# Patient Record
Sex: Female | Born: 1935 | Race: White | Hispanic: No
Health system: Southern US, Community
[De-identification: ages and names within clinical notes are randomized; demographics above are authoritative.]

## PROBLEM LIST (undated history)

## (undated) DIAGNOSIS — I509 Heart failure, unspecified: Secondary | ICD-10-CM

## (undated) DIAGNOSIS — I350 Nonrheumatic aortic (valve) stenosis: Secondary | ICD-10-CM

## (undated) DIAGNOSIS — I4891 Unspecified atrial fibrillation: Secondary | ICD-10-CM

---

## 2005-06-13 ENCOUNTER — Ambulatory Visit: Payer: Self-pay | Admitting: Oncology

## 2005-09-05 ENCOUNTER — Ambulatory Visit: Payer: Self-pay | Admitting: Oncology

## 2005-09-28 ENCOUNTER — Encounter: Admission: RE | Admit: 2005-09-28 | Discharge: 2005-09-28 | Payer: Self-pay | Admitting: Oncology

## 2005-12-26 ENCOUNTER — Ambulatory Visit: Payer: Self-pay | Admitting: Oncology

## 2006-02-20 ENCOUNTER — Ambulatory Visit: Payer: Self-pay | Admitting: Oncology

## 2006-04-17 ENCOUNTER — Ambulatory Visit: Payer: Self-pay | Admitting: Oncology

## 2006-09-29 ENCOUNTER — Encounter: Admission: RE | Admit: 2006-09-29 | Discharge: 2006-09-29 | Payer: Self-pay | Admitting: Oncology

## 2006-10-04 ENCOUNTER — Ambulatory Visit: Payer: Self-pay | Admitting: Oncology

## 2006-10-09 ENCOUNTER — Ambulatory Visit: Payer: Self-pay | Admitting: Oncology

## 2006-11-24 ENCOUNTER — Ambulatory Visit: Payer: Self-pay | Admitting: Oncology

## 2007-04-04 ENCOUNTER — Ambulatory Visit: Payer: Self-pay | Admitting: Oncology

## 2015-03-07 DIAGNOSIS — E039 Hypothyroidism, unspecified: Secondary | ICD-10-CM | POA: Diagnosis not present

## 2015-03-07 DIAGNOSIS — I509 Heart failure, unspecified: Secondary | ICD-10-CM | POA: Diagnosis not present

## 2015-03-07 DIAGNOSIS — I4891 Unspecified atrial fibrillation: Secondary | ICD-10-CM | POA: Diagnosis not present

## 2015-03-07 DIAGNOSIS — F0280 Dementia in other diseases classified elsewhere without behavioral disturbance: Secondary | ICD-10-CM | POA: Diagnosis not present

## 2015-03-24 DIAGNOSIS — F320 Major depressive disorder, single episode, mild: Secondary | ICD-10-CM | POA: Diagnosis not present

## 2015-03-24 DIAGNOSIS — F411 Generalized anxiety disorder: Secondary | ICD-10-CM | POA: Diagnosis not present

## 2015-03-24 DIAGNOSIS — F0280 Dementia in other diseases classified elsewhere without behavioral disturbance: Secondary | ICD-10-CM | POA: Diagnosis not present

## 2015-03-25 DIAGNOSIS — H2513 Age-related nuclear cataract, bilateral: Secondary | ICD-10-CM | POA: Diagnosis not present

## 2015-03-31 DIAGNOSIS — R2689 Other abnormalities of gait and mobility: Secondary | ICD-10-CM | POA: Diagnosis not present

## 2015-03-31 DIAGNOSIS — M25561 Pain in right knee: Secondary | ICD-10-CM | POA: Diagnosis not present

## 2015-03-31 DIAGNOSIS — I5030 Unspecified diastolic (congestive) heart failure: Secondary | ICD-10-CM | POA: Diagnosis not present

## 2015-03-31 DIAGNOSIS — E039 Hypothyroidism, unspecified: Secondary | ICD-10-CM | POA: Diagnosis not present

## 2015-03-31 DIAGNOSIS — I10 Essential (primary) hypertension: Secondary | ICD-10-CM | POA: Diagnosis not present

## 2015-03-31 DIAGNOSIS — R2681 Unsteadiness on feet: Secondary | ICD-10-CM | POA: Diagnosis not present

## 2015-03-31 DIAGNOSIS — K219 Gastro-esophageal reflux disease without esophagitis: Secondary | ICD-10-CM | POA: Diagnosis not present

## 2015-03-31 DIAGNOSIS — I4891 Unspecified atrial fibrillation: Secondary | ICD-10-CM | POA: Diagnosis not present

## 2015-03-31 DIAGNOSIS — M6281 Muscle weakness (generalized): Secondary | ICD-10-CM | POA: Diagnosis not present

## 2015-03-31 DIAGNOSIS — M25569 Pain in unspecified knee: Secondary | ICD-10-CM | POA: Diagnosis not present

## 2015-03-31 DIAGNOSIS — F068 Other specified mental disorders due to known physiological condition: Secondary | ICD-10-CM | POA: Diagnosis not present

## 2015-04-01 DIAGNOSIS — I5030 Unspecified diastolic (congestive) heart failure: Secondary | ICD-10-CM | POA: Diagnosis not present

## 2015-04-01 DIAGNOSIS — M25569 Pain in unspecified knee: Secondary | ICD-10-CM | POA: Diagnosis not present

## 2015-04-01 DIAGNOSIS — F068 Other specified mental disorders due to known physiological condition: Secondary | ICD-10-CM | POA: Diagnosis not present

## 2015-04-01 DIAGNOSIS — M25561 Pain in right knee: Secondary | ICD-10-CM | POA: Diagnosis not present

## 2015-04-01 DIAGNOSIS — I10 Essential (primary) hypertension: Secondary | ICD-10-CM | POA: Diagnosis not present

## 2015-04-01 DIAGNOSIS — E039 Hypothyroidism, unspecified: Secondary | ICD-10-CM | POA: Diagnosis not present

## 2015-04-01 DIAGNOSIS — K219 Gastro-esophageal reflux disease without esophagitis: Secondary | ICD-10-CM | POA: Diagnosis not present

## 2015-04-01 DIAGNOSIS — R2681 Unsteadiness on feet: Secondary | ICD-10-CM | POA: Diagnosis not present

## 2015-04-01 DIAGNOSIS — I4891 Unspecified atrial fibrillation: Secondary | ICD-10-CM | POA: Diagnosis not present

## 2015-04-01 DIAGNOSIS — R2689 Other abnormalities of gait and mobility: Secondary | ICD-10-CM | POA: Diagnosis not present

## 2015-04-01 DIAGNOSIS — M6281 Muscle weakness (generalized): Secondary | ICD-10-CM | POA: Diagnosis not present

## 2015-04-02 DIAGNOSIS — F068 Other specified mental disorders due to known physiological condition: Secondary | ICD-10-CM | POA: Diagnosis not present

## 2015-04-02 DIAGNOSIS — R2681 Unsteadiness on feet: Secondary | ICD-10-CM | POA: Diagnosis not present

## 2015-04-02 DIAGNOSIS — M25561 Pain in right knee: Secondary | ICD-10-CM | POA: Diagnosis not present

## 2015-04-02 DIAGNOSIS — I4891 Unspecified atrial fibrillation: Secondary | ICD-10-CM | POA: Diagnosis not present

## 2015-04-02 DIAGNOSIS — I10 Essential (primary) hypertension: Secondary | ICD-10-CM | POA: Diagnosis not present

## 2015-04-02 DIAGNOSIS — M25569 Pain in unspecified knee: Secondary | ICD-10-CM | POA: Diagnosis not present

## 2015-04-02 DIAGNOSIS — K219 Gastro-esophageal reflux disease without esophagitis: Secondary | ICD-10-CM | POA: Diagnosis not present

## 2015-04-02 DIAGNOSIS — E039 Hypothyroidism, unspecified: Secondary | ICD-10-CM | POA: Diagnosis not present

## 2015-04-02 DIAGNOSIS — M6281 Muscle weakness (generalized): Secondary | ICD-10-CM | POA: Diagnosis not present

## 2015-04-02 DIAGNOSIS — R2689 Other abnormalities of gait and mobility: Secondary | ICD-10-CM | POA: Diagnosis not present

## 2015-04-02 DIAGNOSIS — I5030 Unspecified diastolic (congestive) heart failure: Secondary | ICD-10-CM | POA: Diagnosis not present

## 2015-04-03 DIAGNOSIS — R2689 Other abnormalities of gait and mobility: Secondary | ICD-10-CM | POA: Diagnosis not present

## 2015-04-03 DIAGNOSIS — M25569 Pain in unspecified knee: Secondary | ICD-10-CM | POA: Diagnosis not present

## 2015-04-03 DIAGNOSIS — I5030 Unspecified diastolic (congestive) heart failure: Secondary | ICD-10-CM | POA: Diagnosis not present

## 2015-04-03 DIAGNOSIS — M25561 Pain in right knee: Secondary | ICD-10-CM | POA: Diagnosis not present

## 2015-04-03 DIAGNOSIS — M6281 Muscle weakness (generalized): Secondary | ICD-10-CM | POA: Diagnosis not present

## 2015-04-03 DIAGNOSIS — I10 Essential (primary) hypertension: Secondary | ICD-10-CM | POA: Diagnosis not present

## 2015-04-03 DIAGNOSIS — K219 Gastro-esophageal reflux disease without esophagitis: Secondary | ICD-10-CM | POA: Diagnosis not present

## 2015-04-03 DIAGNOSIS — F068 Other specified mental disorders due to known physiological condition: Secondary | ICD-10-CM | POA: Diagnosis not present

## 2015-04-03 DIAGNOSIS — E039 Hypothyroidism, unspecified: Secondary | ICD-10-CM | POA: Diagnosis not present

## 2015-04-03 DIAGNOSIS — I4891 Unspecified atrial fibrillation: Secondary | ICD-10-CM | POA: Diagnosis not present

## 2015-04-03 DIAGNOSIS — R2681 Unsteadiness on feet: Secondary | ICD-10-CM | POA: Diagnosis not present

## 2015-04-06 DIAGNOSIS — E039 Hypothyroidism, unspecified: Secondary | ICD-10-CM | POA: Diagnosis not present

## 2015-04-06 DIAGNOSIS — M6281 Muscle weakness (generalized): Secondary | ICD-10-CM | POA: Diagnosis not present

## 2015-04-06 DIAGNOSIS — I5030 Unspecified diastolic (congestive) heart failure: Secondary | ICD-10-CM | POA: Diagnosis not present

## 2015-04-06 DIAGNOSIS — M25561 Pain in right knee: Secondary | ICD-10-CM | POA: Diagnosis not present

## 2015-04-06 DIAGNOSIS — F068 Other specified mental disorders due to known physiological condition: Secondary | ICD-10-CM | POA: Diagnosis not present

## 2015-04-06 DIAGNOSIS — I4891 Unspecified atrial fibrillation: Secondary | ICD-10-CM | POA: Diagnosis not present

## 2015-04-06 DIAGNOSIS — I10 Essential (primary) hypertension: Secondary | ICD-10-CM | POA: Diagnosis not present

## 2015-04-06 DIAGNOSIS — K219 Gastro-esophageal reflux disease without esophagitis: Secondary | ICD-10-CM | POA: Diagnosis not present

## 2015-04-06 DIAGNOSIS — R2681 Unsteadiness on feet: Secondary | ICD-10-CM | POA: Diagnosis not present

## 2015-04-06 DIAGNOSIS — R2689 Other abnormalities of gait and mobility: Secondary | ICD-10-CM | POA: Diagnosis not present

## 2015-04-06 DIAGNOSIS — M25569 Pain in unspecified knee: Secondary | ICD-10-CM | POA: Diagnosis not present

## 2015-04-07 DIAGNOSIS — I5030 Unspecified diastolic (congestive) heart failure: Secondary | ICD-10-CM | POA: Diagnosis not present

## 2015-04-07 DIAGNOSIS — R2689 Other abnormalities of gait and mobility: Secondary | ICD-10-CM | POA: Diagnosis not present

## 2015-04-07 DIAGNOSIS — R2681 Unsteadiness on feet: Secondary | ICD-10-CM | POA: Diagnosis not present

## 2015-04-07 DIAGNOSIS — I10 Essential (primary) hypertension: Secondary | ICD-10-CM | POA: Diagnosis not present

## 2015-04-07 DIAGNOSIS — M6281 Muscle weakness (generalized): Secondary | ICD-10-CM | POA: Diagnosis not present

## 2015-04-07 DIAGNOSIS — M25569 Pain in unspecified knee: Secondary | ICD-10-CM | POA: Diagnosis not present

## 2015-04-07 DIAGNOSIS — K219 Gastro-esophageal reflux disease without esophagitis: Secondary | ICD-10-CM | POA: Diagnosis not present

## 2015-04-07 DIAGNOSIS — M25561 Pain in right knee: Secondary | ICD-10-CM | POA: Diagnosis not present

## 2015-04-07 DIAGNOSIS — E039 Hypothyroidism, unspecified: Secondary | ICD-10-CM | POA: Diagnosis not present

## 2015-04-07 DIAGNOSIS — F068 Other specified mental disorders due to known physiological condition: Secondary | ICD-10-CM | POA: Diagnosis not present

## 2015-04-07 DIAGNOSIS — I4891 Unspecified atrial fibrillation: Secondary | ICD-10-CM | POA: Diagnosis not present

## 2015-04-08 DIAGNOSIS — M25561 Pain in right knee: Secondary | ICD-10-CM | POA: Diagnosis not present

## 2015-04-08 DIAGNOSIS — K219 Gastro-esophageal reflux disease without esophagitis: Secondary | ICD-10-CM | POA: Diagnosis not present

## 2015-04-08 DIAGNOSIS — I4891 Unspecified atrial fibrillation: Secondary | ICD-10-CM | POA: Diagnosis not present

## 2015-04-08 DIAGNOSIS — F068 Other specified mental disorders due to known physiological condition: Secondary | ICD-10-CM | POA: Diagnosis not present

## 2015-04-08 DIAGNOSIS — I10 Essential (primary) hypertension: Secondary | ICD-10-CM | POA: Diagnosis not present

## 2015-04-08 DIAGNOSIS — I5030 Unspecified diastolic (congestive) heart failure: Secondary | ICD-10-CM | POA: Diagnosis not present

## 2015-04-08 DIAGNOSIS — R2689 Other abnormalities of gait and mobility: Secondary | ICD-10-CM | POA: Diagnosis not present

## 2015-04-08 DIAGNOSIS — R2681 Unsteadiness on feet: Secondary | ICD-10-CM | POA: Diagnosis not present

## 2015-04-08 DIAGNOSIS — M25569 Pain in unspecified knee: Secondary | ICD-10-CM | POA: Diagnosis not present

## 2015-04-08 DIAGNOSIS — E039 Hypothyroidism, unspecified: Secondary | ICD-10-CM | POA: Diagnosis not present

## 2015-04-08 DIAGNOSIS — M6281 Muscle weakness (generalized): Secondary | ICD-10-CM | POA: Diagnosis not present

## 2015-04-09 DIAGNOSIS — I10 Essential (primary) hypertension: Secondary | ICD-10-CM | POA: Diagnosis not present

## 2015-04-09 DIAGNOSIS — I5030 Unspecified diastolic (congestive) heart failure: Secondary | ICD-10-CM | POA: Diagnosis not present

## 2015-04-09 DIAGNOSIS — R2681 Unsteadiness on feet: Secondary | ICD-10-CM | POA: Diagnosis not present

## 2015-04-09 DIAGNOSIS — R2689 Other abnormalities of gait and mobility: Secondary | ICD-10-CM | POA: Diagnosis not present

## 2015-04-09 DIAGNOSIS — M25561 Pain in right knee: Secondary | ICD-10-CM | POA: Diagnosis not present

## 2015-04-09 DIAGNOSIS — I4891 Unspecified atrial fibrillation: Secondary | ICD-10-CM | POA: Diagnosis not present

## 2015-04-09 DIAGNOSIS — E039 Hypothyroidism, unspecified: Secondary | ICD-10-CM | POA: Diagnosis not present

## 2015-04-09 DIAGNOSIS — F068 Other specified mental disorders due to known physiological condition: Secondary | ICD-10-CM | POA: Diagnosis not present

## 2015-04-09 DIAGNOSIS — K219 Gastro-esophageal reflux disease without esophagitis: Secondary | ICD-10-CM | POA: Diagnosis not present

## 2015-04-09 DIAGNOSIS — M25569 Pain in unspecified knee: Secondary | ICD-10-CM | POA: Diagnosis not present

## 2015-04-09 DIAGNOSIS — M6281 Muscle weakness (generalized): Secondary | ICD-10-CM | POA: Diagnosis not present

## 2015-04-10 DIAGNOSIS — M25561 Pain in right knee: Secondary | ICD-10-CM | POA: Diagnosis not present

## 2015-04-10 DIAGNOSIS — R2689 Other abnormalities of gait and mobility: Secondary | ICD-10-CM | POA: Diagnosis not present

## 2015-04-10 DIAGNOSIS — F068 Other specified mental disorders due to known physiological condition: Secondary | ICD-10-CM | POA: Diagnosis not present

## 2015-04-10 DIAGNOSIS — I10 Essential (primary) hypertension: Secondary | ICD-10-CM | POA: Diagnosis not present

## 2015-04-10 DIAGNOSIS — I4891 Unspecified atrial fibrillation: Secondary | ICD-10-CM | POA: Diagnosis not present

## 2015-04-10 DIAGNOSIS — E039 Hypothyroidism, unspecified: Secondary | ICD-10-CM | POA: Diagnosis not present

## 2015-04-10 DIAGNOSIS — I5030 Unspecified diastolic (congestive) heart failure: Secondary | ICD-10-CM | POA: Diagnosis not present

## 2015-04-10 DIAGNOSIS — R2681 Unsteadiness on feet: Secondary | ICD-10-CM | POA: Diagnosis not present

## 2015-04-10 DIAGNOSIS — M25569 Pain in unspecified knee: Secondary | ICD-10-CM | POA: Diagnosis not present

## 2015-04-10 DIAGNOSIS — K219 Gastro-esophageal reflux disease without esophagitis: Secondary | ICD-10-CM | POA: Diagnosis not present

## 2015-04-10 DIAGNOSIS — M6281 Muscle weakness (generalized): Secondary | ICD-10-CM | POA: Diagnosis not present

## 2015-04-11 DIAGNOSIS — F0280 Dementia in other diseases classified elsewhere without behavioral disturbance: Secondary | ICD-10-CM | POA: Diagnosis not present

## 2015-04-11 DIAGNOSIS — I10 Essential (primary) hypertension: Secondary | ICD-10-CM | POA: Diagnosis not present

## 2015-04-11 DIAGNOSIS — I509 Heart failure, unspecified: Secondary | ICD-10-CM | POA: Diagnosis not present

## 2015-04-11 DIAGNOSIS — I4891 Unspecified atrial fibrillation: Secondary | ICD-10-CM | POA: Diagnosis not present

## 2015-04-13 DIAGNOSIS — E039 Hypothyroidism, unspecified: Secondary | ICD-10-CM | POA: Diagnosis not present

## 2015-04-13 DIAGNOSIS — I5030 Unspecified diastolic (congestive) heart failure: Secondary | ICD-10-CM | POA: Diagnosis not present

## 2015-04-13 DIAGNOSIS — M25569 Pain in unspecified knee: Secondary | ICD-10-CM | POA: Diagnosis not present

## 2015-04-13 DIAGNOSIS — F068 Other specified mental disorders due to known physiological condition: Secondary | ICD-10-CM | POA: Diagnosis not present

## 2015-04-13 DIAGNOSIS — R2681 Unsteadiness on feet: Secondary | ICD-10-CM | POA: Diagnosis not present

## 2015-04-13 DIAGNOSIS — M6281 Muscle weakness (generalized): Secondary | ICD-10-CM | POA: Diagnosis not present

## 2015-04-13 DIAGNOSIS — I10 Essential (primary) hypertension: Secondary | ICD-10-CM | POA: Diagnosis not present

## 2015-04-13 DIAGNOSIS — M25561 Pain in right knee: Secondary | ICD-10-CM | POA: Diagnosis not present

## 2015-04-13 DIAGNOSIS — K219 Gastro-esophageal reflux disease without esophagitis: Secondary | ICD-10-CM | POA: Diagnosis not present

## 2015-04-13 DIAGNOSIS — R2689 Other abnormalities of gait and mobility: Secondary | ICD-10-CM | POA: Diagnosis not present

## 2015-04-13 DIAGNOSIS — I4891 Unspecified atrial fibrillation: Secondary | ICD-10-CM | POA: Diagnosis not present

## 2015-04-14 DIAGNOSIS — F068 Other specified mental disorders due to known physiological condition: Secondary | ICD-10-CM | POA: Diagnosis not present

## 2015-04-14 DIAGNOSIS — M25561 Pain in right knee: Secondary | ICD-10-CM | POA: Diagnosis not present

## 2015-04-14 DIAGNOSIS — I4891 Unspecified atrial fibrillation: Secondary | ICD-10-CM | POA: Diagnosis not present

## 2015-04-14 DIAGNOSIS — K219 Gastro-esophageal reflux disease without esophagitis: Secondary | ICD-10-CM | POA: Diagnosis not present

## 2015-04-14 DIAGNOSIS — R2689 Other abnormalities of gait and mobility: Secondary | ICD-10-CM | POA: Diagnosis not present

## 2015-04-14 DIAGNOSIS — E039 Hypothyroidism, unspecified: Secondary | ICD-10-CM | POA: Diagnosis not present

## 2015-04-14 DIAGNOSIS — I5030 Unspecified diastolic (congestive) heart failure: Secondary | ICD-10-CM | POA: Diagnosis not present

## 2015-04-14 DIAGNOSIS — M6281 Muscle weakness (generalized): Secondary | ICD-10-CM | POA: Diagnosis not present

## 2015-04-14 DIAGNOSIS — I10 Essential (primary) hypertension: Secondary | ICD-10-CM | POA: Diagnosis not present

## 2015-04-14 DIAGNOSIS — R2681 Unsteadiness on feet: Secondary | ICD-10-CM | POA: Diagnosis not present

## 2015-04-14 DIAGNOSIS — M25569 Pain in unspecified knee: Secondary | ICD-10-CM | POA: Diagnosis not present

## 2015-04-15 DIAGNOSIS — E039 Hypothyroidism, unspecified: Secondary | ICD-10-CM | POA: Diagnosis not present

## 2015-04-15 DIAGNOSIS — M25561 Pain in right knee: Secondary | ICD-10-CM | POA: Diagnosis not present

## 2015-04-15 DIAGNOSIS — I5030 Unspecified diastolic (congestive) heart failure: Secondary | ICD-10-CM | POA: Diagnosis not present

## 2015-04-15 DIAGNOSIS — R2681 Unsteadiness on feet: Secondary | ICD-10-CM | POA: Diagnosis not present

## 2015-04-15 DIAGNOSIS — I4891 Unspecified atrial fibrillation: Secondary | ICD-10-CM | POA: Diagnosis not present

## 2015-04-15 DIAGNOSIS — M25569 Pain in unspecified knee: Secondary | ICD-10-CM | POA: Diagnosis not present

## 2015-04-15 DIAGNOSIS — F068 Other specified mental disorders due to known physiological condition: Secondary | ICD-10-CM | POA: Diagnosis not present

## 2015-04-15 DIAGNOSIS — K219 Gastro-esophageal reflux disease without esophagitis: Secondary | ICD-10-CM | POA: Diagnosis not present

## 2015-04-15 DIAGNOSIS — I10 Essential (primary) hypertension: Secondary | ICD-10-CM | POA: Diagnosis not present

## 2015-04-15 DIAGNOSIS — M6281 Muscle weakness (generalized): Secondary | ICD-10-CM | POA: Diagnosis not present

## 2015-04-15 DIAGNOSIS — R2689 Other abnormalities of gait and mobility: Secondary | ICD-10-CM | POA: Diagnosis not present

## 2015-04-16 DIAGNOSIS — I4891 Unspecified atrial fibrillation: Secondary | ICD-10-CM | POA: Diagnosis not present

## 2015-04-16 DIAGNOSIS — M25561 Pain in right knee: Secondary | ICD-10-CM | POA: Diagnosis not present

## 2015-04-16 DIAGNOSIS — I5030 Unspecified diastolic (congestive) heart failure: Secondary | ICD-10-CM | POA: Diagnosis not present

## 2015-04-16 DIAGNOSIS — R2681 Unsteadiness on feet: Secondary | ICD-10-CM | POA: Diagnosis not present

## 2015-04-16 DIAGNOSIS — E039 Hypothyroidism, unspecified: Secondary | ICD-10-CM | POA: Diagnosis not present

## 2015-04-16 DIAGNOSIS — F068 Other specified mental disorders due to known physiological condition: Secondary | ICD-10-CM | POA: Diagnosis not present

## 2015-04-16 DIAGNOSIS — R2689 Other abnormalities of gait and mobility: Secondary | ICD-10-CM | POA: Diagnosis not present

## 2015-04-16 DIAGNOSIS — I10 Essential (primary) hypertension: Secondary | ICD-10-CM | POA: Diagnosis not present

## 2015-04-16 DIAGNOSIS — K219 Gastro-esophageal reflux disease without esophagitis: Secondary | ICD-10-CM | POA: Diagnosis not present

## 2015-04-16 DIAGNOSIS — M6281 Muscle weakness (generalized): Secondary | ICD-10-CM | POA: Diagnosis not present

## 2015-04-16 DIAGNOSIS — M25569 Pain in unspecified knee: Secondary | ICD-10-CM | POA: Diagnosis not present

## 2015-04-17 DIAGNOSIS — M6281 Muscle weakness (generalized): Secondary | ICD-10-CM | POA: Diagnosis not present

## 2015-04-17 DIAGNOSIS — F068 Other specified mental disorders due to known physiological condition: Secondary | ICD-10-CM | POA: Diagnosis not present

## 2015-04-17 DIAGNOSIS — I10 Essential (primary) hypertension: Secondary | ICD-10-CM | POA: Diagnosis not present

## 2015-04-17 DIAGNOSIS — R2681 Unsteadiness on feet: Secondary | ICD-10-CM | POA: Diagnosis not present

## 2015-04-17 DIAGNOSIS — M25561 Pain in right knee: Secondary | ICD-10-CM | POA: Diagnosis not present

## 2015-04-17 DIAGNOSIS — E039 Hypothyroidism, unspecified: Secondary | ICD-10-CM | POA: Diagnosis not present

## 2015-04-17 DIAGNOSIS — K219 Gastro-esophageal reflux disease without esophagitis: Secondary | ICD-10-CM | POA: Diagnosis not present

## 2015-04-17 DIAGNOSIS — M25569 Pain in unspecified knee: Secondary | ICD-10-CM | POA: Diagnosis not present

## 2015-04-17 DIAGNOSIS — R2689 Other abnormalities of gait and mobility: Secondary | ICD-10-CM | POA: Diagnosis not present

## 2015-04-17 DIAGNOSIS — I5030 Unspecified diastolic (congestive) heart failure: Secondary | ICD-10-CM | POA: Diagnosis not present

## 2015-04-17 DIAGNOSIS — I4891 Unspecified atrial fibrillation: Secondary | ICD-10-CM | POA: Diagnosis not present

## 2015-04-20 DIAGNOSIS — I10 Essential (primary) hypertension: Secondary | ICD-10-CM | POA: Diagnosis not present

## 2015-04-20 DIAGNOSIS — R2689 Other abnormalities of gait and mobility: Secondary | ICD-10-CM | POA: Diagnosis not present

## 2015-04-20 DIAGNOSIS — K219 Gastro-esophageal reflux disease without esophagitis: Secondary | ICD-10-CM | POA: Diagnosis not present

## 2015-04-20 DIAGNOSIS — I4891 Unspecified atrial fibrillation: Secondary | ICD-10-CM | POA: Diagnosis not present

## 2015-04-20 DIAGNOSIS — F068 Other specified mental disorders due to known physiological condition: Secondary | ICD-10-CM | POA: Diagnosis not present

## 2015-04-20 DIAGNOSIS — E039 Hypothyroidism, unspecified: Secondary | ICD-10-CM | POA: Diagnosis not present

## 2015-04-20 DIAGNOSIS — I5030 Unspecified diastolic (congestive) heart failure: Secondary | ICD-10-CM | POA: Diagnosis not present

## 2015-04-20 DIAGNOSIS — R2681 Unsteadiness on feet: Secondary | ICD-10-CM | POA: Diagnosis not present

## 2015-04-20 DIAGNOSIS — M25569 Pain in unspecified knee: Secondary | ICD-10-CM | POA: Diagnosis not present

## 2015-04-20 DIAGNOSIS — M25561 Pain in right knee: Secondary | ICD-10-CM | POA: Diagnosis not present

## 2015-04-20 DIAGNOSIS — M6281 Muscle weakness (generalized): Secondary | ICD-10-CM | POA: Diagnosis not present

## 2015-04-21 DIAGNOSIS — R2689 Other abnormalities of gait and mobility: Secondary | ICD-10-CM | POA: Diagnosis not present

## 2015-04-21 DIAGNOSIS — M6281 Muscle weakness (generalized): Secondary | ICD-10-CM | POA: Diagnosis not present

## 2015-04-21 DIAGNOSIS — M25561 Pain in right knee: Secondary | ICD-10-CM | POA: Diagnosis not present

## 2015-04-21 DIAGNOSIS — M25569 Pain in unspecified knee: Secondary | ICD-10-CM | POA: Diagnosis not present

## 2015-04-21 DIAGNOSIS — E039 Hypothyroidism, unspecified: Secondary | ICD-10-CM | POA: Diagnosis not present

## 2015-04-21 DIAGNOSIS — I10 Essential (primary) hypertension: Secondary | ICD-10-CM | POA: Diagnosis not present

## 2015-04-21 DIAGNOSIS — I4891 Unspecified atrial fibrillation: Secondary | ICD-10-CM | POA: Diagnosis not present

## 2015-04-21 DIAGNOSIS — K219 Gastro-esophageal reflux disease without esophagitis: Secondary | ICD-10-CM | POA: Diagnosis not present

## 2015-04-21 DIAGNOSIS — I5030 Unspecified diastolic (congestive) heart failure: Secondary | ICD-10-CM | POA: Diagnosis not present

## 2015-04-21 DIAGNOSIS — R2681 Unsteadiness on feet: Secondary | ICD-10-CM | POA: Diagnosis not present

## 2015-04-21 DIAGNOSIS — F068 Other specified mental disorders due to known physiological condition: Secondary | ICD-10-CM | POA: Diagnosis not present

## 2015-04-22 DIAGNOSIS — R2681 Unsteadiness on feet: Secondary | ICD-10-CM | POA: Diagnosis not present

## 2015-04-22 DIAGNOSIS — E039 Hypothyroidism, unspecified: Secondary | ICD-10-CM | POA: Diagnosis not present

## 2015-04-22 DIAGNOSIS — K219 Gastro-esophageal reflux disease without esophagitis: Secondary | ICD-10-CM | POA: Diagnosis not present

## 2015-04-22 DIAGNOSIS — I4891 Unspecified atrial fibrillation: Secondary | ICD-10-CM | POA: Diagnosis not present

## 2015-04-22 DIAGNOSIS — R2689 Other abnormalities of gait and mobility: Secondary | ICD-10-CM | POA: Diagnosis not present

## 2015-04-22 DIAGNOSIS — I10 Essential (primary) hypertension: Secondary | ICD-10-CM | POA: Diagnosis not present

## 2015-04-22 DIAGNOSIS — M6281 Muscle weakness (generalized): Secondary | ICD-10-CM | POA: Diagnosis not present

## 2015-04-22 DIAGNOSIS — M25569 Pain in unspecified knee: Secondary | ICD-10-CM | POA: Diagnosis not present

## 2015-04-22 DIAGNOSIS — F068 Other specified mental disorders due to known physiological condition: Secondary | ICD-10-CM | POA: Diagnosis not present

## 2015-04-22 DIAGNOSIS — I5030 Unspecified diastolic (congestive) heart failure: Secondary | ICD-10-CM | POA: Diagnosis not present

## 2015-04-22 DIAGNOSIS — M25561 Pain in right knee: Secondary | ICD-10-CM | POA: Diagnosis not present

## 2015-04-23 DIAGNOSIS — I5030 Unspecified diastolic (congestive) heart failure: Secondary | ICD-10-CM | POA: Diagnosis not present

## 2015-04-23 DIAGNOSIS — I10 Essential (primary) hypertension: Secondary | ICD-10-CM | POA: Diagnosis not present

## 2015-04-23 DIAGNOSIS — E039 Hypothyroidism, unspecified: Secondary | ICD-10-CM | POA: Diagnosis not present

## 2015-04-23 DIAGNOSIS — M25569 Pain in unspecified knee: Secondary | ICD-10-CM | POA: Diagnosis not present

## 2015-04-23 DIAGNOSIS — K219 Gastro-esophageal reflux disease without esophagitis: Secondary | ICD-10-CM | POA: Diagnosis not present

## 2015-04-23 DIAGNOSIS — R2681 Unsteadiness on feet: Secondary | ICD-10-CM | POA: Diagnosis not present

## 2015-04-23 DIAGNOSIS — R2689 Other abnormalities of gait and mobility: Secondary | ICD-10-CM | POA: Diagnosis not present

## 2015-04-23 DIAGNOSIS — F068 Other specified mental disorders due to known physiological condition: Secondary | ICD-10-CM | POA: Diagnosis not present

## 2015-04-23 DIAGNOSIS — I4891 Unspecified atrial fibrillation: Secondary | ICD-10-CM | POA: Diagnosis not present

## 2015-04-23 DIAGNOSIS — M6281 Muscle weakness (generalized): Secondary | ICD-10-CM | POA: Diagnosis not present

## 2015-04-23 DIAGNOSIS — M25561 Pain in right knee: Secondary | ICD-10-CM | POA: Diagnosis not present

## 2015-04-24 DIAGNOSIS — R2681 Unsteadiness on feet: Secondary | ICD-10-CM | POA: Diagnosis not present

## 2015-04-24 DIAGNOSIS — M25561 Pain in right knee: Secondary | ICD-10-CM | POA: Diagnosis not present

## 2015-04-24 DIAGNOSIS — F068 Other specified mental disorders due to known physiological condition: Secondary | ICD-10-CM | POA: Diagnosis not present

## 2015-04-24 DIAGNOSIS — I4891 Unspecified atrial fibrillation: Secondary | ICD-10-CM | POA: Diagnosis not present

## 2015-04-24 DIAGNOSIS — R2689 Other abnormalities of gait and mobility: Secondary | ICD-10-CM | POA: Diagnosis not present

## 2015-04-24 DIAGNOSIS — I10 Essential (primary) hypertension: Secondary | ICD-10-CM | POA: Diagnosis not present

## 2015-04-24 DIAGNOSIS — M6281 Muscle weakness (generalized): Secondary | ICD-10-CM | POA: Diagnosis not present

## 2015-04-24 DIAGNOSIS — I5030 Unspecified diastolic (congestive) heart failure: Secondary | ICD-10-CM | POA: Diagnosis not present

## 2015-04-24 DIAGNOSIS — E039 Hypothyroidism, unspecified: Secondary | ICD-10-CM | POA: Diagnosis not present

## 2015-04-24 DIAGNOSIS — K219 Gastro-esophageal reflux disease without esophagitis: Secondary | ICD-10-CM | POA: Diagnosis not present

## 2015-04-24 DIAGNOSIS — M25569 Pain in unspecified knee: Secondary | ICD-10-CM | POA: Diagnosis not present

## 2015-05-16 DIAGNOSIS — I4891 Unspecified atrial fibrillation: Secondary | ICD-10-CM | POA: Diagnosis not present

## 2015-05-16 DIAGNOSIS — F0280 Dementia in other diseases classified elsewhere without behavioral disturbance: Secondary | ICD-10-CM | POA: Diagnosis not present

## 2015-05-16 DIAGNOSIS — I509 Heart failure, unspecified: Secondary | ICD-10-CM | POA: Diagnosis not present

## 2015-05-21 DIAGNOSIS — I10 Essential (primary) hypertension: Secondary | ICD-10-CM | POA: Diagnosis not present

## 2015-05-21 DIAGNOSIS — M109 Gout, unspecified: Secondary | ICD-10-CM | POA: Diagnosis not present

## 2015-05-21 DIAGNOSIS — Z79899 Other long term (current) drug therapy: Secondary | ICD-10-CM | POA: Diagnosis not present

## 2015-06-04 DIAGNOSIS — E559 Vitamin D deficiency, unspecified: Secondary | ICD-10-CM | POA: Diagnosis not present

## 2015-06-10 DIAGNOSIS — I4891 Unspecified atrial fibrillation: Secondary | ICD-10-CM | POA: Diagnosis not present

## 2015-06-10 DIAGNOSIS — Z1389 Encounter for screening for other disorder: Secondary | ICD-10-CM | POA: Diagnosis not present

## 2015-06-10 DIAGNOSIS — Z0000 Encounter for general adult medical examination without abnormal findings: Secondary | ICD-10-CM | POA: Diagnosis not present

## 2015-06-10 DIAGNOSIS — Z853 Personal history of malignant neoplasm of breast: Secondary | ICD-10-CM | POA: Diagnosis not present

## 2015-06-10 DIAGNOSIS — R32 Unspecified urinary incontinence: Secondary | ICD-10-CM | POA: Diagnosis not present

## 2015-06-10 DIAGNOSIS — Z1211 Encounter for screening for malignant neoplasm of colon: Secondary | ICD-10-CM | POA: Diagnosis not present

## 2015-06-10 DIAGNOSIS — Z139 Encounter for screening, unspecified: Secondary | ICD-10-CM | POA: Diagnosis not present

## 2015-06-10 DIAGNOSIS — M109 Gout, unspecified: Secondary | ICD-10-CM | POA: Diagnosis not present

## 2015-07-07 DIAGNOSIS — F411 Generalized anxiety disorder: Secondary | ICD-10-CM | POA: Diagnosis not present

## 2015-07-07 DIAGNOSIS — F320 Major depressive disorder, single episode, mild: Secondary | ICD-10-CM | POA: Diagnosis not present

## 2015-07-07 DIAGNOSIS — F0280 Dementia in other diseases classified elsewhere without behavioral disturbance: Secondary | ICD-10-CM | POA: Diagnosis not present

## 2015-07-07 DIAGNOSIS — G309 Alzheimer's disease, unspecified: Secondary | ICD-10-CM | POA: Diagnosis not present

## 2015-07-17 DIAGNOSIS — F0390 Unspecified dementia without behavioral disturbance: Secondary | ICD-10-CM | POA: Diagnosis not present

## 2015-07-17 DIAGNOSIS — I421 Obstructive hypertrophic cardiomyopathy: Secondary | ICD-10-CM | POA: Diagnosis not present

## 2015-07-17 DIAGNOSIS — I4891 Unspecified atrial fibrillation: Secondary | ICD-10-CM | POA: Diagnosis not present

## 2015-07-17 DIAGNOSIS — I10 Essential (primary) hypertension: Secondary | ICD-10-CM | POA: Diagnosis not present

## 2015-08-15 DIAGNOSIS — Z79899 Other long term (current) drug therapy: Secondary | ICD-10-CM | POA: Diagnosis not present

## 2015-08-29 DIAGNOSIS — I4891 Unspecified atrial fibrillation: Secondary | ICD-10-CM | POA: Diagnosis not present

## 2015-08-29 DIAGNOSIS — I10 Essential (primary) hypertension: Secondary | ICD-10-CM | POA: Diagnosis not present

## 2015-08-29 DIAGNOSIS — I429 Cardiomyopathy, unspecified: Secondary | ICD-10-CM | POA: Diagnosis not present

## 2015-08-29 DIAGNOSIS — F0390 Unspecified dementia without behavioral disturbance: Secondary | ICD-10-CM | POA: Diagnosis not present

## 2015-09-01 DIAGNOSIS — Z79899 Other long term (current) drug therapy: Secondary | ICD-10-CM | POA: Diagnosis not present

## 2015-09-03 DIAGNOSIS — M25569 Pain in unspecified knee: Secondary | ICD-10-CM | POA: Diagnosis not present

## 2015-09-03 DIAGNOSIS — F068 Other specified mental disorders due to known physiological condition: Secondary | ICD-10-CM | POA: Diagnosis not present

## 2015-09-03 DIAGNOSIS — I10 Essential (primary) hypertension: Secondary | ICD-10-CM | POA: Diagnosis not present

## 2015-09-03 DIAGNOSIS — I4891 Unspecified atrial fibrillation: Secondary | ICD-10-CM | POA: Diagnosis not present

## 2015-09-03 DIAGNOSIS — M6281 Muscle weakness (generalized): Secondary | ICD-10-CM | POA: Diagnosis not present

## 2015-09-03 DIAGNOSIS — I5030 Unspecified diastolic (congestive) heart failure: Secondary | ICD-10-CM | POA: Diagnosis not present

## 2015-09-03 DIAGNOSIS — K219 Gastro-esophageal reflux disease without esophagitis: Secondary | ICD-10-CM | POA: Diagnosis not present

## 2015-09-03 DIAGNOSIS — R2681 Unsteadiness on feet: Secondary | ICD-10-CM | POA: Diagnosis not present

## 2015-09-03 DIAGNOSIS — R2689 Other abnormalities of gait and mobility: Secondary | ICD-10-CM | POA: Diagnosis not present

## 2015-09-03 DIAGNOSIS — M25561 Pain in right knee: Secondary | ICD-10-CM | POA: Diagnosis not present

## 2015-09-03 DIAGNOSIS — R488 Other symbolic dysfunctions: Secondary | ICD-10-CM | POA: Diagnosis not present

## 2015-09-03 DIAGNOSIS — E039 Hypothyroidism, unspecified: Secondary | ICD-10-CM | POA: Diagnosis not present

## 2015-09-07 DIAGNOSIS — I10 Essential (primary) hypertension: Secondary | ICD-10-CM | POA: Diagnosis not present

## 2015-09-07 DIAGNOSIS — M25561 Pain in right knee: Secondary | ICD-10-CM | POA: Diagnosis not present

## 2015-09-07 DIAGNOSIS — M6281 Muscle weakness (generalized): Secondary | ICD-10-CM | POA: Diagnosis not present

## 2015-09-07 DIAGNOSIS — R2681 Unsteadiness on feet: Secondary | ICD-10-CM | POA: Diagnosis not present

## 2015-09-07 DIAGNOSIS — I5030 Unspecified diastolic (congestive) heart failure: Secondary | ICD-10-CM | POA: Diagnosis not present

## 2015-09-07 DIAGNOSIS — K219 Gastro-esophageal reflux disease without esophagitis: Secondary | ICD-10-CM | POA: Diagnosis not present

## 2015-09-07 DIAGNOSIS — R2689 Other abnormalities of gait and mobility: Secondary | ICD-10-CM | POA: Diagnosis not present

## 2015-09-07 DIAGNOSIS — F068 Other specified mental disorders due to known physiological condition: Secondary | ICD-10-CM | POA: Diagnosis not present

## 2015-09-07 DIAGNOSIS — I4891 Unspecified atrial fibrillation: Secondary | ICD-10-CM | POA: Diagnosis not present

## 2015-09-07 DIAGNOSIS — F0280 Dementia in other diseases classified elsewhere without behavioral disturbance: Secondary | ICD-10-CM | POA: Diagnosis not present

## 2015-09-07 DIAGNOSIS — R488 Other symbolic dysfunctions: Secondary | ICD-10-CM | POA: Diagnosis not present

## 2015-09-07 DIAGNOSIS — E039 Hypothyroidism, unspecified: Secondary | ICD-10-CM | POA: Diagnosis not present

## 2015-09-07 DIAGNOSIS — M25569 Pain in unspecified knee: Secondary | ICD-10-CM | POA: Diagnosis not present

## 2015-09-09 DIAGNOSIS — M6281 Muscle weakness (generalized): Secondary | ICD-10-CM | POA: Diagnosis not present

## 2015-09-09 DIAGNOSIS — F0280 Dementia in other diseases classified elsewhere without behavioral disturbance: Secondary | ICD-10-CM | POA: Diagnosis not present

## 2015-09-09 DIAGNOSIS — M25569 Pain in unspecified knee: Secondary | ICD-10-CM | POA: Diagnosis not present

## 2015-09-09 DIAGNOSIS — I10 Essential (primary) hypertension: Secondary | ICD-10-CM | POA: Diagnosis not present

## 2015-09-09 DIAGNOSIS — K219 Gastro-esophageal reflux disease without esophagitis: Secondary | ICD-10-CM | POA: Diagnosis not present

## 2015-09-09 DIAGNOSIS — I4891 Unspecified atrial fibrillation: Secondary | ICD-10-CM | POA: Diagnosis not present

## 2015-09-09 DIAGNOSIS — R488 Other symbolic dysfunctions: Secondary | ICD-10-CM | POA: Diagnosis not present

## 2015-09-09 DIAGNOSIS — M25561 Pain in right knee: Secondary | ICD-10-CM | POA: Diagnosis not present

## 2015-09-09 DIAGNOSIS — E039 Hypothyroidism, unspecified: Secondary | ICD-10-CM | POA: Diagnosis not present

## 2015-09-09 DIAGNOSIS — I5030 Unspecified diastolic (congestive) heart failure: Secondary | ICD-10-CM | POA: Diagnosis not present

## 2015-09-09 DIAGNOSIS — R2689 Other abnormalities of gait and mobility: Secondary | ICD-10-CM | POA: Diagnosis not present

## 2015-09-09 DIAGNOSIS — R2681 Unsteadiness on feet: Secondary | ICD-10-CM | POA: Diagnosis not present

## 2015-09-09 DIAGNOSIS — F068 Other specified mental disorders due to known physiological condition: Secondary | ICD-10-CM | POA: Diagnosis not present

## 2015-09-10 DIAGNOSIS — E039 Hypothyroidism, unspecified: Secondary | ICD-10-CM | POA: Diagnosis not present

## 2015-09-10 DIAGNOSIS — M6281 Muscle weakness (generalized): Secondary | ICD-10-CM | POA: Diagnosis not present

## 2015-09-10 DIAGNOSIS — F0280 Dementia in other diseases classified elsewhere without behavioral disturbance: Secondary | ICD-10-CM | POA: Diagnosis not present

## 2015-09-10 DIAGNOSIS — I10 Essential (primary) hypertension: Secondary | ICD-10-CM | POA: Diagnosis not present

## 2015-09-10 DIAGNOSIS — K219 Gastro-esophageal reflux disease without esophagitis: Secondary | ICD-10-CM | POA: Diagnosis not present

## 2015-09-10 DIAGNOSIS — I5030 Unspecified diastolic (congestive) heart failure: Secondary | ICD-10-CM | POA: Diagnosis not present

## 2015-09-10 DIAGNOSIS — R2689 Other abnormalities of gait and mobility: Secondary | ICD-10-CM | POA: Diagnosis not present

## 2015-09-10 DIAGNOSIS — I4891 Unspecified atrial fibrillation: Secondary | ICD-10-CM | POA: Diagnosis not present

## 2015-09-10 DIAGNOSIS — F068 Other specified mental disorders due to known physiological condition: Secondary | ICD-10-CM | POA: Diagnosis not present

## 2015-09-10 DIAGNOSIS — M25569 Pain in unspecified knee: Secondary | ICD-10-CM | POA: Diagnosis not present

## 2015-09-10 DIAGNOSIS — R488 Other symbolic dysfunctions: Secondary | ICD-10-CM | POA: Diagnosis not present

## 2015-09-10 DIAGNOSIS — M25561 Pain in right knee: Secondary | ICD-10-CM | POA: Diagnosis not present

## 2015-09-10 DIAGNOSIS — R2681 Unsteadiness on feet: Secondary | ICD-10-CM | POA: Diagnosis not present

## 2015-09-11 DIAGNOSIS — I10 Essential (primary) hypertension: Secondary | ICD-10-CM | POA: Diagnosis not present

## 2015-09-11 DIAGNOSIS — E039 Hypothyroidism, unspecified: Secondary | ICD-10-CM | POA: Diagnosis not present

## 2015-09-11 DIAGNOSIS — R2689 Other abnormalities of gait and mobility: Secondary | ICD-10-CM | POA: Diagnosis not present

## 2015-09-11 DIAGNOSIS — M25569 Pain in unspecified knee: Secondary | ICD-10-CM | POA: Diagnosis not present

## 2015-09-11 DIAGNOSIS — K219 Gastro-esophageal reflux disease without esophagitis: Secondary | ICD-10-CM | POA: Diagnosis not present

## 2015-09-11 DIAGNOSIS — R488 Other symbolic dysfunctions: Secondary | ICD-10-CM | POA: Diagnosis not present

## 2015-09-11 DIAGNOSIS — M25561 Pain in right knee: Secondary | ICD-10-CM | POA: Diagnosis not present

## 2015-09-11 DIAGNOSIS — F0280 Dementia in other diseases classified elsewhere without behavioral disturbance: Secondary | ICD-10-CM | POA: Diagnosis not present

## 2015-09-11 DIAGNOSIS — M6281 Muscle weakness (generalized): Secondary | ICD-10-CM | POA: Diagnosis not present

## 2015-09-11 DIAGNOSIS — F068 Other specified mental disorders due to known physiological condition: Secondary | ICD-10-CM | POA: Diagnosis not present

## 2015-09-11 DIAGNOSIS — R2681 Unsteadiness on feet: Secondary | ICD-10-CM | POA: Diagnosis not present

## 2015-09-11 DIAGNOSIS — I5030 Unspecified diastolic (congestive) heart failure: Secondary | ICD-10-CM | POA: Diagnosis not present

## 2015-09-11 DIAGNOSIS — I4891 Unspecified atrial fibrillation: Secondary | ICD-10-CM | POA: Diagnosis not present

## 2015-09-12 DIAGNOSIS — I5030 Unspecified diastolic (congestive) heart failure: Secondary | ICD-10-CM | POA: Diagnosis not present

## 2015-09-12 DIAGNOSIS — F068 Other specified mental disorders due to known physiological condition: Secondary | ICD-10-CM | POA: Diagnosis not present

## 2015-09-12 DIAGNOSIS — M25561 Pain in right knee: Secondary | ICD-10-CM | POA: Diagnosis not present

## 2015-09-12 DIAGNOSIS — M25569 Pain in unspecified knee: Secondary | ICD-10-CM | POA: Diagnosis not present

## 2015-09-12 DIAGNOSIS — K219 Gastro-esophageal reflux disease without esophagitis: Secondary | ICD-10-CM | POA: Diagnosis not present

## 2015-09-12 DIAGNOSIS — I10 Essential (primary) hypertension: Secondary | ICD-10-CM | POA: Diagnosis not present

## 2015-09-12 DIAGNOSIS — E039 Hypothyroidism, unspecified: Secondary | ICD-10-CM | POA: Diagnosis not present

## 2015-09-12 DIAGNOSIS — I4891 Unspecified atrial fibrillation: Secondary | ICD-10-CM | POA: Diagnosis not present

## 2015-09-12 DIAGNOSIS — R488 Other symbolic dysfunctions: Secondary | ICD-10-CM | POA: Diagnosis not present

## 2015-09-12 DIAGNOSIS — M6281 Muscle weakness (generalized): Secondary | ICD-10-CM | POA: Diagnosis not present

## 2015-09-12 DIAGNOSIS — R2681 Unsteadiness on feet: Secondary | ICD-10-CM | POA: Diagnosis not present

## 2015-09-12 DIAGNOSIS — F0280 Dementia in other diseases classified elsewhere without behavioral disturbance: Secondary | ICD-10-CM | POA: Diagnosis not present

## 2015-09-12 DIAGNOSIS — R2689 Other abnormalities of gait and mobility: Secondary | ICD-10-CM | POA: Diagnosis not present

## 2015-09-17 DIAGNOSIS — I4891 Unspecified atrial fibrillation: Secondary | ICD-10-CM | POA: Diagnosis not present

## 2015-09-17 DIAGNOSIS — K219 Gastro-esophageal reflux disease without esophagitis: Secondary | ICD-10-CM | POA: Diagnosis not present

## 2015-09-17 DIAGNOSIS — R488 Other symbolic dysfunctions: Secondary | ICD-10-CM | POA: Diagnosis not present

## 2015-09-17 DIAGNOSIS — E039 Hypothyroidism, unspecified: Secondary | ICD-10-CM | POA: Diagnosis not present

## 2015-09-17 DIAGNOSIS — R2689 Other abnormalities of gait and mobility: Secondary | ICD-10-CM | POA: Diagnosis not present

## 2015-09-17 DIAGNOSIS — M6281 Muscle weakness (generalized): Secondary | ICD-10-CM | POA: Diagnosis not present

## 2015-09-17 DIAGNOSIS — F0280 Dementia in other diseases classified elsewhere without behavioral disturbance: Secondary | ICD-10-CM | POA: Diagnosis not present

## 2015-09-17 DIAGNOSIS — I5030 Unspecified diastolic (congestive) heart failure: Secondary | ICD-10-CM | POA: Diagnosis not present

## 2015-09-17 DIAGNOSIS — R2681 Unsteadiness on feet: Secondary | ICD-10-CM | POA: Diagnosis not present

## 2015-09-17 DIAGNOSIS — M25569 Pain in unspecified knee: Secondary | ICD-10-CM | POA: Diagnosis not present

## 2015-09-17 DIAGNOSIS — M25561 Pain in right knee: Secondary | ICD-10-CM | POA: Diagnosis not present

## 2015-09-17 DIAGNOSIS — I10 Essential (primary) hypertension: Secondary | ICD-10-CM | POA: Diagnosis not present

## 2015-09-17 DIAGNOSIS — F068 Other specified mental disorders due to known physiological condition: Secondary | ICD-10-CM | POA: Diagnosis not present

## 2015-10-03 DIAGNOSIS — F0390 Unspecified dementia without behavioral disturbance: Secondary | ICD-10-CM | POA: Diagnosis not present

## 2015-10-03 DIAGNOSIS — I421 Obstructive hypertrophic cardiomyopathy: Secondary | ICD-10-CM | POA: Diagnosis not present

## 2015-10-03 DIAGNOSIS — I10 Essential (primary) hypertension: Secondary | ICD-10-CM | POA: Diagnosis not present

## 2015-10-03 DIAGNOSIS — I4891 Unspecified atrial fibrillation: Secondary | ICD-10-CM | POA: Diagnosis not present

## 2015-10-13 DIAGNOSIS — N390 Urinary tract infection, site not specified: Secondary | ICD-10-CM | POA: Diagnosis not present

## 2015-11-07 DIAGNOSIS — F0390 Unspecified dementia without behavioral disturbance: Secondary | ICD-10-CM | POA: Diagnosis not present

## 2015-11-07 DIAGNOSIS — I10 Essential (primary) hypertension: Secondary | ICD-10-CM | POA: Diagnosis not present

## 2015-11-07 DIAGNOSIS — I4891 Unspecified atrial fibrillation: Secondary | ICD-10-CM | POA: Diagnosis not present

## 2015-11-07 DIAGNOSIS — M1000 Idiopathic gout, unspecified site: Secondary | ICD-10-CM | POA: Diagnosis not present

## 2015-11-20 DIAGNOSIS — I4891 Unspecified atrial fibrillation: Secondary | ICD-10-CM | POA: Diagnosis not present

## 2015-11-20 DIAGNOSIS — I10 Essential (primary) hypertension: Secondary | ICD-10-CM | POA: Diagnosis not present

## 2015-11-20 DIAGNOSIS — E039 Hypothyroidism, unspecified: Secondary | ICD-10-CM | POA: Diagnosis not present

## 2015-11-20 DIAGNOSIS — M109 Gout, unspecified: Secondary | ICD-10-CM | POA: Diagnosis not present

## 2015-11-20 DIAGNOSIS — Z79899 Other long term (current) drug therapy: Secondary | ICD-10-CM | POA: Diagnosis not present

## 2015-12-16 DIAGNOSIS — R0989 Other specified symptoms and signs involving the circulatory and respiratory systems: Secondary | ICD-10-CM | POA: Diagnosis not present

## 2015-12-16 DIAGNOSIS — R05 Cough: Secondary | ICD-10-CM | POA: Diagnosis not present

## 2015-12-16 DIAGNOSIS — I421 Obstructive hypertrophic cardiomyopathy: Secondary | ICD-10-CM | POA: Diagnosis not present

## 2015-12-26 DIAGNOSIS — Z79899 Other long term (current) drug therapy: Secondary | ICD-10-CM | POA: Diagnosis not present

## 2016-01-19 DIAGNOSIS — R05 Cough: Secondary | ICD-10-CM | POA: Diagnosis not present

## 2016-01-19 DIAGNOSIS — R0989 Other specified symptoms and signs involving the circulatory and respiratory systems: Secondary | ICD-10-CM | POA: Diagnosis not present

## 2016-02-08 DIAGNOSIS — F0280 Dementia in other diseases classified elsewhere without behavioral disturbance: Secondary | ICD-10-CM | POA: Diagnosis not present

## 2016-02-08 DIAGNOSIS — I5030 Unspecified diastolic (congestive) heart failure: Secondary | ICD-10-CM | POA: Diagnosis not present

## 2016-02-08 DIAGNOSIS — M6281 Muscle weakness (generalized): Secondary | ICD-10-CM | POA: Diagnosis not present

## 2016-02-08 DIAGNOSIS — E039 Hypothyroidism, unspecified: Secondary | ICD-10-CM | POA: Diagnosis not present

## 2016-02-08 DIAGNOSIS — M25569 Pain in unspecified knee: Secondary | ICD-10-CM | POA: Diagnosis not present

## 2016-02-08 DIAGNOSIS — M25561 Pain in right knee: Secondary | ICD-10-CM | POA: Diagnosis not present

## 2016-02-08 DIAGNOSIS — K219 Gastro-esophageal reflux disease without esophagitis: Secondary | ICD-10-CM | POA: Diagnosis not present

## 2016-02-08 DIAGNOSIS — F068 Other specified mental disorders due to known physiological condition: Secondary | ICD-10-CM | POA: Diagnosis not present

## 2016-02-08 DIAGNOSIS — I10 Essential (primary) hypertension: Secondary | ICD-10-CM | POA: Diagnosis not present

## 2016-02-08 DIAGNOSIS — R2681 Unsteadiness on feet: Secondary | ICD-10-CM | POA: Diagnosis not present

## 2016-02-08 DIAGNOSIS — R2689 Other abnormalities of gait and mobility: Secondary | ICD-10-CM | POA: Diagnosis not present

## 2016-02-08 DIAGNOSIS — R488 Other symbolic dysfunctions: Secondary | ICD-10-CM | POA: Diagnosis not present

## 2016-02-08 DIAGNOSIS — I4891 Unspecified atrial fibrillation: Secondary | ICD-10-CM | POA: Diagnosis not present

## 2016-02-09 DIAGNOSIS — E039 Hypothyroidism, unspecified: Secondary | ICD-10-CM | POA: Diagnosis not present

## 2016-02-09 DIAGNOSIS — F0280 Dementia in other diseases classified elsewhere without behavioral disturbance: Secondary | ICD-10-CM | POA: Diagnosis not present

## 2016-02-09 DIAGNOSIS — M6281 Muscle weakness (generalized): Secondary | ICD-10-CM | POA: Diagnosis not present

## 2016-02-09 DIAGNOSIS — K219 Gastro-esophageal reflux disease without esophagitis: Secondary | ICD-10-CM | POA: Diagnosis not present

## 2016-02-09 DIAGNOSIS — R488 Other symbolic dysfunctions: Secondary | ICD-10-CM | POA: Diagnosis not present

## 2016-02-09 DIAGNOSIS — F068 Other specified mental disorders due to known physiological condition: Secondary | ICD-10-CM | POA: Diagnosis not present

## 2016-02-09 DIAGNOSIS — R2689 Other abnormalities of gait and mobility: Secondary | ICD-10-CM | POA: Diagnosis not present

## 2016-02-09 DIAGNOSIS — M25569 Pain in unspecified knee: Secondary | ICD-10-CM | POA: Diagnosis not present

## 2016-02-09 DIAGNOSIS — R2681 Unsteadiness on feet: Secondary | ICD-10-CM | POA: Diagnosis not present

## 2016-02-09 DIAGNOSIS — M25561 Pain in right knee: Secondary | ICD-10-CM | POA: Diagnosis not present

## 2016-02-09 DIAGNOSIS — I5030 Unspecified diastolic (congestive) heart failure: Secondary | ICD-10-CM | POA: Diagnosis not present

## 2016-02-09 DIAGNOSIS — I4891 Unspecified atrial fibrillation: Secondary | ICD-10-CM | POA: Diagnosis not present

## 2016-02-09 DIAGNOSIS — I10 Essential (primary) hypertension: Secondary | ICD-10-CM | POA: Diagnosis not present

## 2016-02-10 DIAGNOSIS — F068 Other specified mental disorders due to known physiological condition: Secondary | ICD-10-CM | POA: Diagnosis not present

## 2016-02-10 DIAGNOSIS — F0280 Dementia in other diseases classified elsewhere without behavioral disturbance: Secondary | ICD-10-CM | POA: Diagnosis not present

## 2016-02-10 DIAGNOSIS — M25569 Pain in unspecified knee: Secondary | ICD-10-CM | POA: Diagnosis not present

## 2016-02-10 DIAGNOSIS — I4891 Unspecified atrial fibrillation: Secondary | ICD-10-CM | POA: Diagnosis not present

## 2016-02-10 DIAGNOSIS — K219 Gastro-esophageal reflux disease without esophagitis: Secondary | ICD-10-CM | POA: Diagnosis not present

## 2016-02-10 DIAGNOSIS — E039 Hypothyroidism, unspecified: Secondary | ICD-10-CM | POA: Diagnosis not present

## 2016-02-10 DIAGNOSIS — M6281 Muscle weakness (generalized): Secondary | ICD-10-CM | POA: Diagnosis not present

## 2016-02-10 DIAGNOSIS — R2681 Unsteadiness on feet: Secondary | ICD-10-CM | POA: Diagnosis not present

## 2016-02-10 DIAGNOSIS — R488 Other symbolic dysfunctions: Secondary | ICD-10-CM | POA: Diagnosis not present

## 2016-02-10 DIAGNOSIS — I5030 Unspecified diastolic (congestive) heart failure: Secondary | ICD-10-CM | POA: Diagnosis not present

## 2016-02-10 DIAGNOSIS — I10 Essential (primary) hypertension: Secondary | ICD-10-CM | POA: Diagnosis not present

## 2016-02-10 DIAGNOSIS — R2689 Other abnormalities of gait and mobility: Secondary | ICD-10-CM | POA: Diagnosis not present

## 2016-02-10 DIAGNOSIS — M25561 Pain in right knee: Secondary | ICD-10-CM | POA: Diagnosis not present

## 2016-02-11 DIAGNOSIS — I10 Essential (primary) hypertension: Secondary | ICD-10-CM | POA: Diagnosis not present

## 2016-02-11 DIAGNOSIS — F068 Other specified mental disorders due to known physiological condition: Secondary | ICD-10-CM | POA: Diagnosis not present

## 2016-02-11 DIAGNOSIS — R2689 Other abnormalities of gait and mobility: Secondary | ICD-10-CM | POA: Diagnosis not present

## 2016-02-11 DIAGNOSIS — R488 Other symbolic dysfunctions: Secondary | ICD-10-CM | POA: Diagnosis not present

## 2016-02-11 DIAGNOSIS — I4891 Unspecified atrial fibrillation: Secondary | ICD-10-CM | POA: Diagnosis not present

## 2016-02-11 DIAGNOSIS — M6281 Muscle weakness (generalized): Secondary | ICD-10-CM | POA: Diagnosis not present

## 2016-02-11 DIAGNOSIS — E039 Hypothyroidism, unspecified: Secondary | ICD-10-CM | POA: Diagnosis not present

## 2016-02-11 DIAGNOSIS — I5030 Unspecified diastolic (congestive) heart failure: Secondary | ICD-10-CM | POA: Diagnosis not present

## 2016-02-11 DIAGNOSIS — M25561 Pain in right knee: Secondary | ICD-10-CM | POA: Diagnosis not present

## 2016-02-11 DIAGNOSIS — M25569 Pain in unspecified knee: Secondary | ICD-10-CM | POA: Diagnosis not present

## 2016-02-11 DIAGNOSIS — F0280 Dementia in other diseases classified elsewhere without behavioral disturbance: Secondary | ICD-10-CM | POA: Diagnosis not present

## 2016-02-11 DIAGNOSIS — R2681 Unsteadiness on feet: Secondary | ICD-10-CM | POA: Diagnosis not present

## 2016-02-11 DIAGNOSIS — K219 Gastro-esophageal reflux disease without esophagitis: Secondary | ICD-10-CM | POA: Diagnosis not present

## 2016-02-12 DIAGNOSIS — M6281 Muscle weakness (generalized): Secondary | ICD-10-CM | POA: Diagnosis not present

## 2016-02-12 DIAGNOSIS — I5030 Unspecified diastolic (congestive) heart failure: Secondary | ICD-10-CM | POA: Diagnosis not present

## 2016-02-12 DIAGNOSIS — I4891 Unspecified atrial fibrillation: Secondary | ICD-10-CM | POA: Diagnosis not present

## 2016-02-12 DIAGNOSIS — R2689 Other abnormalities of gait and mobility: Secondary | ICD-10-CM | POA: Diagnosis not present

## 2016-02-12 DIAGNOSIS — M25561 Pain in right knee: Secondary | ICD-10-CM | POA: Diagnosis not present

## 2016-02-12 DIAGNOSIS — R2681 Unsteadiness on feet: Secondary | ICD-10-CM | POA: Diagnosis not present

## 2016-02-12 DIAGNOSIS — R488 Other symbolic dysfunctions: Secondary | ICD-10-CM | POA: Diagnosis not present

## 2016-02-12 DIAGNOSIS — I10 Essential (primary) hypertension: Secondary | ICD-10-CM | POA: Diagnosis not present

## 2016-02-12 DIAGNOSIS — K219 Gastro-esophageal reflux disease without esophagitis: Secondary | ICD-10-CM | POA: Diagnosis not present

## 2016-02-12 DIAGNOSIS — F068 Other specified mental disorders due to known physiological condition: Secondary | ICD-10-CM | POA: Diagnosis not present

## 2016-02-12 DIAGNOSIS — E039 Hypothyroidism, unspecified: Secondary | ICD-10-CM | POA: Diagnosis not present

## 2016-02-12 DIAGNOSIS — F0280 Dementia in other diseases classified elsewhere without behavioral disturbance: Secondary | ICD-10-CM | POA: Diagnosis not present

## 2016-02-12 DIAGNOSIS — M25569 Pain in unspecified knee: Secondary | ICD-10-CM | POA: Diagnosis not present

## 2016-02-15 DIAGNOSIS — M6281 Muscle weakness (generalized): Secondary | ICD-10-CM | POA: Diagnosis not present

## 2016-02-15 DIAGNOSIS — R488 Other symbolic dysfunctions: Secondary | ICD-10-CM | POA: Diagnosis not present

## 2016-02-15 DIAGNOSIS — E039 Hypothyroidism, unspecified: Secondary | ICD-10-CM | POA: Diagnosis not present

## 2016-02-15 DIAGNOSIS — F0280 Dementia in other diseases classified elsewhere without behavioral disturbance: Secondary | ICD-10-CM | POA: Diagnosis not present

## 2016-02-15 DIAGNOSIS — F068 Other specified mental disorders due to known physiological condition: Secondary | ICD-10-CM | POA: Diagnosis not present

## 2016-02-15 DIAGNOSIS — R2681 Unsteadiness on feet: Secondary | ICD-10-CM | POA: Diagnosis not present

## 2016-02-15 DIAGNOSIS — M25561 Pain in right knee: Secondary | ICD-10-CM | POA: Diagnosis not present

## 2016-02-15 DIAGNOSIS — R2689 Other abnormalities of gait and mobility: Secondary | ICD-10-CM | POA: Diagnosis not present

## 2016-02-15 DIAGNOSIS — K219 Gastro-esophageal reflux disease without esophagitis: Secondary | ICD-10-CM | POA: Diagnosis not present

## 2016-02-15 DIAGNOSIS — I10 Essential (primary) hypertension: Secondary | ICD-10-CM | POA: Diagnosis not present

## 2016-02-15 DIAGNOSIS — I5030 Unspecified diastolic (congestive) heart failure: Secondary | ICD-10-CM | POA: Diagnosis not present

## 2016-02-15 DIAGNOSIS — I4891 Unspecified atrial fibrillation: Secondary | ICD-10-CM | POA: Diagnosis not present

## 2016-02-15 DIAGNOSIS — M25569 Pain in unspecified knee: Secondary | ICD-10-CM | POA: Diagnosis not present

## 2016-02-16 DIAGNOSIS — R2681 Unsteadiness on feet: Secondary | ICD-10-CM | POA: Diagnosis not present

## 2016-02-16 DIAGNOSIS — R488 Other symbolic dysfunctions: Secondary | ICD-10-CM | POA: Diagnosis not present

## 2016-02-16 DIAGNOSIS — I4891 Unspecified atrial fibrillation: Secondary | ICD-10-CM | POA: Diagnosis not present

## 2016-02-16 DIAGNOSIS — I10 Essential (primary) hypertension: Secondary | ICD-10-CM | POA: Diagnosis not present

## 2016-02-16 DIAGNOSIS — M25561 Pain in right knee: Secondary | ICD-10-CM | POA: Diagnosis not present

## 2016-02-16 DIAGNOSIS — F0280 Dementia in other diseases classified elsewhere without behavioral disturbance: Secondary | ICD-10-CM | POA: Diagnosis not present

## 2016-02-16 DIAGNOSIS — F068 Other specified mental disorders due to known physiological condition: Secondary | ICD-10-CM | POA: Diagnosis not present

## 2016-02-16 DIAGNOSIS — I5030 Unspecified diastolic (congestive) heart failure: Secondary | ICD-10-CM | POA: Diagnosis not present

## 2016-02-16 DIAGNOSIS — R2689 Other abnormalities of gait and mobility: Secondary | ICD-10-CM | POA: Diagnosis not present

## 2016-02-16 DIAGNOSIS — M25569 Pain in unspecified knee: Secondary | ICD-10-CM | POA: Diagnosis not present

## 2016-02-16 DIAGNOSIS — M6281 Muscle weakness (generalized): Secondary | ICD-10-CM | POA: Diagnosis not present

## 2016-02-16 DIAGNOSIS — K219 Gastro-esophageal reflux disease without esophagitis: Secondary | ICD-10-CM | POA: Diagnosis not present

## 2016-02-16 DIAGNOSIS — E039 Hypothyroidism, unspecified: Secondary | ICD-10-CM | POA: Diagnosis not present

## 2016-02-17 DIAGNOSIS — F0280 Dementia in other diseases classified elsewhere without behavioral disturbance: Secondary | ICD-10-CM | POA: Diagnosis not present

## 2016-02-17 DIAGNOSIS — M25569 Pain in unspecified knee: Secondary | ICD-10-CM | POA: Diagnosis not present

## 2016-02-17 DIAGNOSIS — R2681 Unsteadiness on feet: Secondary | ICD-10-CM | POA: Diagnosis not present

## 2016-02-17 DIAGNOSIS — K219 Gastro-esophageal reflux disease without esophagitis: Secondary | ICD-10-CM | POA: Diagnosis not present

## 2016-02-17 DIAGNOSIS — F068 Other specified mental disorders due to known physiological condition: Secondary | ICD-10-CM | POA: Diagnosis not present

## 2016-02-17 DIAGNOSIS — I5030 Unspecified diastolic (congestive) heart failure: Secondary | ICD-10-CM | POA: Diagnosis not present

## 2016-02-17 DIAGNOSIS — I10 Essential (primary) hypertension: Secondary | ICD-10-CM | POA: Diagnosis not present

## 2016-02-17 DIAGNOSIS — R488 Other symbolic dysfunctions: Secondary | ICD-10-CM | POA: Diagnosis not present

## 2016-02-17 DIAGNOSIS — E039 Hypothyroidism, unspecified: Secondary | ICD-10-CM | POA: Diagnosis not present

## 2016-02-17 DIAGNOSIS — M6281 Muscle weakness (generalized): Secondary | ICD-10-CM | POA: Diagnosis not present

## 2016-02-17 DIAGNOSIS — R2689 Other abnormalities of gait and mobility: Secondary | ICD-10-CM | POA: Diagnosis not present

## 2016-02-17 DIAGNOSIS — I4891 Unspecified atrial fibrillation: Secondary | ICD-10-CM | POA: Diagnosis not present

## 2016-02-17 DIAGNOSIS — M25561 Pain in right knee: Secondary | ICD-10-CM | POA: Diagnosis not present

## 2016-02-18 DIAGNOSIS — R2689 Other abnormalities of gait and mobility: Secondary | ICD-10-CM | POA: Diagnosis not present

## 2016-02-18 DIAGNOSIS — M25569 Pain in unspecified knee: Secondary | ICD-10-CM | POA: Diagnosis not present

## 2016-02-18 DIAGNOSIS — M25561 Pain in right knee: Secondary | ICD-10-CM | POA: Diagnosis not present

## 2016-02-18 DIAGNOSIS — R2681 Unsteadiness on feet: Secondary | ICD-10-CM | POA: Diagnosis not present

## 2016-02-18 DIAGNOSIS — K219 Gastro-esophageal reflux disease without esophagitis: Secondary | ICD-10-CM | POA: Diagnosis not present

## 2016-02-18 DIAGNOSIS — M6281 Muscle weakness (generalized): Secondary | ICD-10-CM | POA: Diagnosis not present

## 2016-02-18 DIAGNOSIS — F068 Other specified mental disorders due to known physiological condition: Secondary | ICD-10-CM | POA: Diagnosis not present

## 2016-02-18 DIAGNOSIS — F0280 Dementia in other diseases classified elsewhere without behavioral disturbance: Secondary | ICD-10-CM | POA: Diagnosis not present

## 2016-02-18 DIAGNOSIS — I4891 Unspecified atrial fibrillation: Secondary | ICD-10-CM | POA: Diagnosis not present

## 2016-02-18 DIAGNOSIS — I5030 Unspecified diastolic (congestive) heart failure: Secondary | ICD-10-CM | POA: Diagnosis not present

## 2016-02-18 DIAGNOSIS — E039 Hypothyroidism, unspecified: Secondary | ICD-10-CM | POA: Diagnosis not present

## 2016-02-18 DIAGNOSIS — I10 Essential (primary) hypertension: Secondary | ICD-10-CM | POA: Diagnosis not present

## 2016-02-18 DIAGNOSIS — R488 Other symbolic dysfunctions: Secondary | ICD-10-CM | POA: Diagnosis not present

## 2016-02-19 DIAGNOSIS — F068 Other specified mental disorders due to known physiological condition: Secondary | ICD-10-CM | POA: Diagnosis not present

## 2016-02-19 DIAGNOSIS — K219 Gastro-esophageal reflux disease without esophagitis: Secondary | ICD-10-CM | POA: Diagnosis not present

## 2016-02-19 DIAGNOSIS — E039 Hypothyroidism, unspecified: Secondary | ICD-10-CM | POA: Diagnosis not present

## 2016-02-19 DIAGNOSIS — M6281 Muscle weakness (generalized): Secondary | ICD-10-CM | POA: Diagnosis not present

## 2016-02-19 DIAGNOSIS — I5030 Unspecified diastolic (congestive) heart failure: Secondary | ICD-10-CM | POA: Diagnosis not present

## 2016-02-19 DIAGNOSIS — I10 Essential (primary) hypertension: Secondary | ICD-10-CM | POA: Diagnosis not present

## 2016-02-19 DIAGNOSIS — R488 Other symbolic dysfunctions: Secondary | ICD-10-CM | POA: Diagnosis not present

## 2016-02-19 DIAGNOSIS — R2681 Unsteadiness on feet: Secondary | ICD-10-CM | POA: Diagnosis not present

## 2016-02-19 DIAGNOSIS — R2689 Other abnormalities of gait and mobility: Secondary | ICD-10-CM | POA: Diagnosis not present

## 2016-02-19 DIAGNOSIS — I4891 Unspecified atrial fibrillation: Secondary | ICD-10-CM | POA: Diagnosis not present

## 2016-02-19 DIAGNOSIS — M25569 Pain in unspecified knee: Secondary | ICD-10-CM | POA: Diagnosis not present

## 2016-02-19 DIAGNOSIS — F0280 Dementia in other diseases classified elsewhere without behavioral disturbance: Secondary | ICD-10-CM | POA: Diagnosis not present

## 2016-02-19 DIAGNOSIS — I421 Obstructive hypertrophic cardiomyopathy: Secondary | ICD-10-CM | POA: Diagnosis not present

## 2016-02-19 DIAGNOSIS — M25561 Pain in right knee: Secondary | ICD-10-CM | POA: Diagnosis not present

## 2016-02-22 DIAGNOSIS — E039 Hypothyroidism, unspecified: Secondary | ICD-10-CM | POA: Diagnosis not present

## 2016-02-22 DIAGNOSIS — M25561 Pain in right knee: Secondary | ICD-10-CM | POA: Diagnosis not present

## 2016-02-22 DIAGNOSIS — R488 Other symbolic dysfunctions: Secondary | ICD-10-CM | POA: Diagnosis not present

## 2016-02-22 DIAGNOSIS — F0280 Dementia in other diseases classified elsewhere without behavioral disturbance: Secondary | ICD-10-CM | POA: Diagnosis not present

## 2016-02-22 DIAGNOSIS — I5030 Unspecified diastolic (congestive) heart failure: Secondary | ICD-10-CM | POA: Diagnosis not present

## 2016-02-22 DIAGNOSIS — I10 Essential (primary) hypertension: Secondary | ICD-10-CM | POA: Diagnosis not present

## 2016-02-22 DIAGNOSIS — R2689 Other abnormalities of gait and mobility: Secondary | ICD-10-CM | POA: Diagnosis not present

## 2016-02-22 DIAGNOSIS — M25569 Pain in unspecified knee: Secondary | ICD-10-CM | POA: Diagnosis not present

## 2016-02-22 DIAGNOSIS — K219 Gastro-esophageal reflux disease without esophagitis: Secondary | ICD-10-CM | POA: Diagnosis not present

## 2016-02-22 DIAGNOSIS — M6281 Muscle weakness (generalized): Secondary | ICD-10-CM | POA: Diagnosis not present

## 2016-02-22 DIAGNOSIS — F068 Other specified mental disorders due to known physiological condition: Secondary | ICD-10-CM | POA: Diagnosis not present

## 2016-02-22 DIAGNOSIS — R2681 Unsteadiness on feet: Secondary | ICD-10-CM | POA: Diagnosis not present

## 2016-02-22 DIAGNOSIS — I4891 Unspecified atrial fibrillation: Secondary | ICD-10-CM | POA: Diagnosis not present

## 2016-02-23 DIAGNOSIS — R2681 Unsteadiness on feet: Secondary | ICD-10-CM | POA: Diagnosis not present

## 2016-02-23 DIAGNOSIS — R488 Other symbolic dysfunctions: Secondary | ICD-10-CM | POA: Diagnosis not present

## 2016-02-23 DIAGNOSIS — M6281 Muscle weakness (generalized): Secondary | ICD-10-CM | POA: Diagnosis not present

## 2016-02-23 DIAGNOSIS — E039 Hypothyroidism, unspecified: Secondary | ICD-10-CM | POA: Diagnosis not present

## 2016-02-23 DIAGNOSIS — R2689 Other abnormalities of gait and mobility: Secondary | ICD-10-CM | POA: Diagnosis not present

## 2016-02-23 DIAGNOSIS — M25561 Pain in right knee: Secondary | ICD-10-CM | POA: Diagnosis not present

## 2016-02-23 DIAGNOSIS — I5030 Unspecified diastolic (congestive) heart failure: Secondary | ICD-10-CM | POA: Diagnosis not present

## 2016-02-23 DIAGNOSIS — I4891 Unspecified atrial fibrillation: Secondary | ICD-10-CM | POA: Diagnosis not present

## 2016-02-23 DIAGNOSIS — F0280 Dementia in other diseases classified elsewhere without behavioral disturbance: Secondary | ICD-10-CM | POA: Diagnosis not present

## 2016-02-23 DIAGNOSIS — I10 Essential (primary) hypertension: Secondary | ICD-10-CM | POA: Diagnosis not present

## 2016-02-23 DIAGNOSIS — F068 Other specified mental disorders due to known physiological condition: Secondary | ICD-10-CM | POA: Diagnosis not present

## 2016-02-23 DIAGNOSIS — K219 Gastro-esophageal reflux disease without esophagitis: Secondary | ICD-10-CM | POA: Diagnosis not present

## 2016-02-23 DIAGNOSIS — M25569 Pain in unspecified knee: Secondary | ICD-10-CM | POA: Diagnosis not present

## 2016-02-24 DIAGNOSIS — M6281 Muscle weakness (generalized): Secondary | ICD-10-CM | POA: Diagnosis not present

## 2016-02-24 DIAGNOSIS — R488 Other symbolic dysfunctions: Secondary | ICD-10-CM | POA: Diagnosis not present

## 2016-02-24 DIAGNOSIS — R2681 Unsteadiness on feet: Secondary | ICD-10-CM | POA: Diagnosis not present

## 2016-02-24 DIAGNOSIS — I10 Essential (primary) hypertension: Secondary | ICD-10-CM | POA: Diagnosis not present

## 2016-02-24 DIAGNOSIS — R2689 Other abnormalities of gait and mobility: Secondary | ICD-10-CM | POA: Diagnosis not present

## 2016-02-24 DIAGNOSIS — E039 Hypothyroidism, unspecified: Secondary | ICD-10-CM | POA: Diagnosis not present

## 2016-02-24 DIAGNOSIS — I4891 Unspecified atrial fibrillation: Secondary | ICD-10-CM | POA: Diagnosis not present

## 2016-02-24 DIAGNOSIS — I5030 Unspecified diastolic (congestive) heart failure: Secondary | ICD-10-CM | POA: Diagnosis not present

## 2016-02-24 DIAGNOSIS — F0280 Dementia in other diseases classified elsewhere without behavioral disturbance: Secondary | ICD-10-CM | POA: Diagnosis not present

## 2016-02-24 DIAGNOSIS — K219 Gastro-esophageal reflux disease without esophagitis: Secondary | ICD-10-CM | POA: Diagnosis not present

## 2016-02-24 DIAGNOSIS — M25569 Pain in unspecified knee: Secondary | ICD-10-CM | POA: Diagnosis not present

## 2016-02-24 DIAGNOSIS — F068 Other specified mental disorders due to known physiological condition: Secondary | ICD-10-CM | POA: Diagnosis not present

## 2016-02-24 DIAGNOSIS — M25561 Pain in right knee: Secondary | ICD-10-CM | POA: Diagnosis not present

## 2016-02-25 DIAGNOSIS — I5030 Unspecified diastolic (congestive) heart failure: Secondary | ICD-10-CM | POA: Diagnosis not present

## 2016-02-25 DIAGNOSIS — I4891 Unspecified atrial fibrillation: Secondary | ICD-10-CM | POA: Diagnosis not present

## 2016-02-25 DIAGNOSIS — M25561 Pain in right knee: Secondary | ICD-10-CM | POA: Diagnosis not present

## 2016-02-25 DIAGNOSIS — R2689 Other abnormalities of gait and mobility: Secondary | ICD-10-CM | POA: Diagnosis not present

## 2016-02-25 DIAGNOSIS — M6281 Muscle weakness (generalized): Secondary | ICD-10-CM | POA: Diagnosis not present

## 2016-02-25 DIAGNOSIS — R488 Other symbolic dysfunctions: Secondary | ICD-10-CM | POA: Diagnosis not present

## 2016-02-25 DIAGNOSIS — K219 Gastro-esophageal reflux disease without esophagitis: Secondary | ICD-10-CM | POA: Diagnosis not present

## 2016-02-25 DIAGNOSIS — F0280 Dementia in other diseases classified elsewhere without behavioral disturbance: Secondary | ICD-10-CM | POA: Diagnosis not present

## 2016-02-25 DIAGNOSIS — F068 Other specified mental disorders due to known physiological condition: Secondary | ICD-10-CM | POA: Diagnosis not present

## 2016-02-25 DIAGNOSIS — I10 Essential (primary) hypertension: Secondary | ICD-10-CM | POA: Diagnosis not present

## 2016-02-25 DIAGNOSIS — E039 Hypothyroidism, unspecified: Secondary | ICD-10-CM | POA: Diagnosis not present

## 2016-02-25 DIAGNOSIS — M25569 Pain in unspecified knee: Secondary | ICD-10-CM | POA: Diagnosis not present

## 2016-02-25 DIAGNOSIS — R2681 Unsteadiness on feet: Secondary | ICD-10-CM | POA: Diagnosis not present

## 2016-02-26 DIAGNOSIS — M25569 Pain in unspecified knee: Secondary | ICD-10-CM | POA: Diagnosis not present

## 2016-02-26 DIAGNOSIS — F068 Other specified mental disorders due to known physiological condition: Secondary | ICD-10-CM | POA: Diagnosis not present

## 2016-02-26 DIAGNOSIS — M25561 Pain in right knee: Secondary | ICD-10-CM | POA: Diagnosis not present

## 2016-02-26 DIAGNOSIS — I5030 Unspecified diastolic (congestive) heart failure: Secondary | ICD-10-CM | POA: Diagnosis not present

## 2016-02-26 DIAGNOSIS — M6281 Muscle weakness (generalized): Secondary | ICD-10-CM | POA: Diagnosis not present

## 2016-02-26 DIAGNOSIS — E039 Hypothyroidism, unspecified: Secondary | ICD-10-CM | POA: Diagnosis not present

## 2016-02-26 DIAGNOSIS — I10 Essential (primary) hypertension: Secondary | ICD-10-CM | POA: Diagnosis not present

## 2016-02-26 DIAGNOSIS — R2681 Unsteadiness on feet: Secondary | ICD-10-CM | POA: Diagnosis not present

## 2016-02-26 DIAGNOSIS — K219 Gastro-esophageal reflux disease without esophagitis: Secondary | ICD-10-CM | POA: Diagnosis not present

## 2016-02-26 DIAGNOSIS — R488 Other symbolic dysfunctions: Secondary | ICD-10-CM | POA: Diagnosis not present

## 2016-02-26 DIAGNOSIS — I4891 Unspecified atrial fibrillation: Secondary | ICD-10-CM | POA: Diagnosis not present

## 2016-02-26 DIAGNOSIS — R2689 Other abnormalities of gait and mobility: Secondary | ICD-10-CM | POA: Diagnosis not present

## 2016-02-26 DIAGNOSIS — F0280 Dementia in other diseases classified elsewhere without behavioral disturbance: Secondary | ICD-10-CM | POA: Diagnosis not present

## 2016-02-29 DIAGNOSIS — M6281 Muscle weakness (generalized): Secondary | ICD-10-CM | POA: Diagnosis not present

## 2016-02-29 DIAGNOSIS — R2689 Other abnormalities of gait and mobility: Secondary | ICD-10-CM | POA: Diagnosis not present

## 2016-02-29 DIAGNOSIS — F0280 Dementia in other diseases classified elsewhere without behavioral disturbance: Secondary | ICD-10-CM | POA: Diagnosis not present

## 2016-02-29 DIAGNOSIS — M25569 Pain in unspecified knee: Secondary | ICD-10-CM | POA: Diagnosis not present

## 2016-02-29 DIAGNOSIS — R2681 Unsteadiness on feet: Secondary | ICD-10-CM | POA: Diagnosis not present

## 2016-02-29 DIAGNOSIS — I10 Essential (primary) hypertension: Secondary | ICD-10-CM | POA: Diagnosis not present

## 2016-02-29 DIAGNOSIS — R488 Other symbolic dysfunctions: Secondary | ICD-10-CM | POA: Diagnosis not present

## 2016-02-29 DIAGNOSIS — K219 Gastro-esophageal reflux disease without esophagitis: Secondary | ICD-10-CM | POA: Diagnosis not present

## 2016-02-29 DIAGNOSIS — F068 Other specified mental disorders due to known physiological condition: Secondary | ICD-10-CM | POA: Diagnosis not present

## 2016-02-29 DIAGNOSIS — I5030 Unspecified diastolic (congestive) heart failure: Secondary | ICD-10-CM | POA: Diagnosis not present

## 2016-02-29 DIAGNOSIS — E039 Hypothyroidism, unspecified: Secondary | ICD-10-CM | POA: Diagnosis not present

## 2016-02-29 DIAGNOSIS — I4891 Unspecified atrial fibrillation: Secondary | ICD-10-CM | POA: Diagnosis not present

## 2016-02-29 DIAGNOSIS — M25561 Pain in right knee: Secondary | ICD-10-CM | POA: Diagnosis not present

## 2016-03-01 DIAGNOSIS — R2689 Other abnormalities of gait and mobility: Secondary | ICD-10-CM | POA: Diagnosis not present

## 2016-03-01 DIAGNOSIS — M25569 Pain in unspecified knee: Secondary | ICD-10-CM | POA: Diagnosis not present

## 2016-03-01 DIAGNOSIS — I5030 Unspecified diastolic (congestive) heart failure: Secondary | ICD-10-CM | POA: Diagnosis not present

## 2016-03-01 DIAGNOSIS — M6281 Muscle weakness (generalized): Secondary | ICD-10-CM | POA: Diagnosis not present

## 2016-03-01 DIAGNOSIS — I4891 Unspecified atrial fibrillation: Secondary | ICD-10-CM | POA: Diagnosis not present

## 2016-03-01 DIAGNOSIS — E039 Hypothyroidism, unspecified: Secondary | ICD-10-CM | POA: Diagnosis not present

## 2016-03-01 DIAGNOSIS — R488 Other symbolic dysfunctions: Secondary | ICD-10-CM | POA: Diagnosis not present

## 2016-03-01 DIAGNOSIS — K219 Gastro-esophageal reflux disease without esophagitis: Secondary | ICD-10-CM | POA: Diagnosis not present

## 2016-03-01 DIAGNOSIS — I10 Essential (primary) hypertension: Secondary | ICD-10-CM | POA: Diagnosis not present

## 2016-03-01 DIAGNOSIS — M25561 Pain in right knee: Secondary | ICD-10-CM | POA: Diagnosis not present

## 2016-03-01 DIAGNOSIS — F0280 Dementia in other diseases classified elsewhere without behavioral disturbance: Secondary | ICD-10-CM | POA: Diagnosis not present

## 2016-03-01 DIAGNOSIS — R2681 Unsteadiness on feet: Secondary | ICD-10-CM | POA: Diagnosis not present

## 2016-03-01 DIAGNOSIS — F068 Other specified mental disorders due to known physiological condition: Secondary | ICD-10-CM | POA: Diagnosis not present

## 2016-03-02 DIAGNOSIS — F068 Other specified mental disorders due to known physiological condition: Secondary | ICD-10-CM | POA: Diagnosis not present

## 2016-03-02 DIAGNOSIS — R2689 Other abnormalities of gait and mobility: Secondary | ICD-10-CM | POA: Diagnosis not present

## 2016-03-02 DIAGNOSIS — I5030 Unspecified diastolic (congestive) heart failure: Secondary | ICD-10-CM | POA: Diagnosis not present

## 2016-03-02 DIAGNOSIS — M25561 Pain in right knee: Secondary | ICD-10-CM | POA: Diagnosis not present

## 2016-03-02 DIAGNOSIS — F0280 Dementia in other diseases classified elsewhere without behavioral disturbance: Secondary | ICD-10-CM | POA: Diagnosis not present

## 2016-03-02 DIAGNOSIS — M6281 Muscle weakness (generalized): Secondary | ICD-10-CM | POA: Diagnosis not present

## 2016-03-02 DIAGNOSIS — I10 Essential (primary) hypertension: Secondary | ICD-10-CM | POA: Diagnosis not present

## 2016-03-02 DIAGNOSIS — E039 Hypothyroidism, unspecified: Secondary | ICD-10-CM | POA: Diagnosis not present

## 2016-03-02 DIAGNOSIS — R488 Other symbolic dysfunctions: Secondary | ICD-10-CM | POA: Diagnosis not present

## 2016-03-02 DIAGNOSIS — I4891 Unspecified atrial fibrillation: Secondary | ICD-10-CM | POA: Diagnosis not present

## 2016-03-02 DIAGNOSIS — M25569 Pain in unspecified knee: Secondary | ICD-10-CM | POA: Diagnosis not present

## 2016-03-02 DIAGNOSIS — R2681 Unsteadiness on feet: Secondary | ICD-10-CM | POA: Diagnosis not present

## 2016-03-02 DIAGNOSIS — K219 Gastro-esophageal reflux disease without esophagitis: Secondary | ICD-10-CM | POA: Diagnosis not present

## 2016-03-03 DIAGNOSIS — M6281 Muscle weakness (generalized): Secondary | ICD-10-CM | POA: Diagnosis not present

## 2016-03-03 DIAGNOSIS — I4891 Unspecified atrial fibrillation: Secondary | ICD-10-CM | POA: Diagnosis not present

## 2016-03-03 DIAGNOSIS — R488 Other symbolic dysfunctions: Secondary | ICD-10-CM | POA: Diagnosis not present

## 2016-03-03 DIAGNOSIS — I5030 Unspecified diastolic (congestive) heart failure: Secondary | ICD-10-CM | POA: Diagnosis not present

## 2016-03-03 DIAGNOSIS — M25561 Pain in right knee: Secondary | ICD-10-CM | POA: Diagnosis not present

## 2016-03-03 DIAGNOSIS — E039 Hypothyroidism, unspecified: Secondary | ICD-10-CM | POA: Diagnosis not present

## 2016-03-03 DIAGNOSIS — M25569 Pain in unspecified knee: Secondary | ICD-10-CM | POA: Diagnosis not present

## 2016-03-03 DIAGNOSIS — I10 Essential (primary) hypertension: Secondary | ICD-10-CM | POA: Diagnosis not present

## 2016-03-03 DIAGNOSIS — R2681 Unsteadiness on feet: Secondary | ICD-10-CM | POA: Diagnosis not present

## 2016-03-03 DIAGNOSIS — K219 Gastro-esophageal reflux disease without esophagitis: Secondary | ICD-10-CM | POA: Diagnosis not present

## 2016-03-03 DIAGNOSIS — F0280 Dementia in other diseases classified elsewhere without behavioral disturbance: Secondary | ICD-10-CM | POA: Diagnosis not present

## 2016-03-03 DIAGNOSIS — F068 Other specified mental disorders due to known physiological condition: Secondary | ICD-10-CM | POA: Diagnosis not present

## 2016-03-03 DIAGNOSIS — R2689 Other abnormalities of gait and mobility: Secondary | ICD-10-CM | POA: Diagnosis not present

## 2016-04-10 DIAGNOSIS — N390 Urinary tract infection, site not specified: Secondary | ICD-10-CM | POA: Diagnosis not present

## 2016-04-12 DIAGNOSIS — H2513 Age-related nuclear cataract, bilateral: Secondary | ICD-10-CM | POA: Diagnosis not present

## 2016-05-29 DIAGNOSIS — I4891 Unspecified atrial fibrillation: Secondary | ICD-10-CM | POA: Diagnosis not present

## 2016-05-29 DIAGNOSIS — I5030 Unspecified diastolic (congestive) heart failure: Secondary | ICD-10-CM | POA: Diagnosis not present

## 2016-05-29 DIAGNOSIS — M109 Gout, unspecified: Secondary | ICD-10-CM | POA: Diagnosis not present

## 2016-05-29 DIAGNOSIS — E039 Hypothyroidism, unspecified: Secondary | ICD-10-CM | POA: Diagnosis not present

## 2016-05-29 DIAGNOSIS — I10 Essential (primary) hypertension: Secondary | ICD-10-CM | POA: Diagnosis not present

## 2016-05-30 DIAGNOSIS — I10 Essential (primary) hypertension: Secondary | ICD-10-CM | POA: Diagnosis not present

## 2016-05-30 DIAGNOSIS — I4891 Unspecified atrial fibrillation: Secondary | ICD-10-CM | POA: Diagnosis not present

## 2016-05-30 DIAGNOSIS — E039 Hypothyroidism, unspecified: Secondary | ICD-10-CM | POA: Diagnosis not present

## 2016-05-30 DIAGNOSIS — I5030 Unspecified diastolic (congestive) heart failure: Secondary | ICD-10-CM | POA: Diagnosis not present

## 2016-06-22 DIAGNOSIS — L219 Seborrheic dermatitis, unspecified: Secondary | ICD-10-CM | POA: Diagnosis not present

## 2016-07-01 DIAGNOSIS — N390 Urinary tract infection, site not specified: Secondary | ICD-10-CM | POA: Diagnosis not present

## 2016-07-20 DIAGNOSIS — M25569 Pain in unspecified knee: Secondary | ICD-10-CM | POA: Diagnosis not present

## 2016-07-20 DIAGNOSIS — I10 Essential (primary) hypertension: Secondary | ICD-10-CM | POA: Diagnosis not present

## 2016-07-20 DIAGNOSIS — M25561 Pain in right knee: Secondary | ICD-10-CM | POA: Diagnosis not present

## 2016-07-20 DIAGNOSIS — R262 Difficulty in walking, not elsewhere classified: Secondary | ICD-10-CM | POA: Diagnosis not present

## 2016-07-20 DIAGNOSIS — R488 Other symbolic dysfunctions: Secondary | ICD-10-CM | POA: Diagnosis not present

## 2016-07-20 DIAGNOSIS — I4891 Unspecified atrial fibrillation: Secondary | ICD-10-CM | POA: Diagnosis not present

## 2016-07-20 DIAGNOSIS — R2689 Other abnormalities of gait and mobility: Secondary | ICD-10-CM | POA: Diagnosis not present

## 2016-07-20 DIAGNOSIS — I5030 Unspecified diastolic (congestive) heart failure: Secondary | ICD-10-CM | POA: Diagnosis not present

## 2016-07-20 DIAGNOSIS — E039 Hypothyroidism, unspecified: Secondary | ICD-10-CM | POA: Diagnosis not present

## 2016-07-20 DIAGNOSIS — M6281 Muscle weakness (generalized): Secondary | ICD-10-CM | POA: Diagnosis not present

## 2016-07-20 DIAGNOSIS — K219 Gastro-esophageal reflux disease without esophagitis: Secondary | ICD-10-CM | POA: Diagnosis not present

## 2016-07-20 DIAGNOSIS — R2681 Unsteadiness on feet: Secondary | ICD-10-CM | POA: Diagnosis not present

## 2016-07-21 DIAGNOSIS — I5030 Unspecified diastolic (congestive) heart failure: Secondary | ICD-10-CM | POA: Diagnosis not present

## 2016-07-21 DIAGNOSIS — R262 Difficulty in walking, not elsewhere classified: Secondary | ICD-10-CM | POA: Diagnosis not present

## 2016-07-21 DIAGNOSIS — E039 Hypothyroidism, unspecified: Secondary | ICD-10-CM | POA: Diagnosis not present

## 2016-07-21 DIAGNOSIS — R2681 Unsteadiness on feet: Secondary | ICD-10-CM | POA: Diagnosis not present

## 2016-07-21 DIAGNOSIS — R488 Other symbolic dysfunctions: Secondary | ICD-10-CM | POA: Diagnosis not present

## 2016-07-21 DIAGNOSIS — K219 Gastro-esophageal reflux disease without esophagitis: Secondary | ICD-10-CM | POA: Diagnosis not present

## 2016-07-21 DIAGNOSIS — M6281 Muscle weakness (generalized): Secondary | ICD-10-CM | POA: Diagnosis not present

## 2016-07-21 DIAGNOSIS — I4891 Unspecified atrial fibrillation: Secondary | ICD-10-CM | POA: Diagnosis not present

## 2016-07-21 DIAGNOSIS — R2689 Other abnormalities of gait and mobility: Secondary | ICD-10-CM | POA: Diagnosis not present

## 2016-07-21 DIAGNOSIS — M25569 Pain in unspecified knee: Secondary | ICD-10-CM | POA: Diagnosis not present

## 2016-07-21 DIAGNOSIS — M25561 Pain in right knee: Secondary | ICD-10-CM | POA: Diagnosis not present

## 2016-07-21 DIAGNOSIS — I10 Essential (primary) hypertension: Secondary | ICD-10-CM | POA: Diagnosis not present

## 2016-07-26 DIAGNOSIS — K219 Gastro-esophageal reflux disease without esophagitis: Secondary | ICD-10-CM | POA: Diagnosis not present

## 2016-07-26 DIAGNOSIS — R2681 Unsteadiness on feet: Secondary | ICD-10-CM | POA: Diagnosis not present

## 2016-07-26 DIAGNOSIS — M6281 Muscle weakness (generalized): Secondary | ICD-10-CM | POA: Diagnosis not present

## 2016-07-26 DIAGNOSIS — M25569 Pain in unspecified knee: Secondary | ICD-10-CM | POA: Diagnosis not present

## 2016-07-26 DIAGNOSIS — I4891 Unspecified atrial fibrillation: Secondary | ICD-10-CM | POA: Diagnosis not present

## 2016-07-26 DIAGNOSIS — I5030 Unspecified diastolic (congestive) heart failure: Secondary | ICD-10-CM | POA: Diagnosis not present

## 2016-07-26 DIAGNOSIS — R2689 Other abnormalities of gait and mobility: Secondary | ICD-10-CM | POA: Diagnosis not present

## 2016-07-26 DIAGNOSIS — M25561 Pain in right knee: Secondary | ICD-10-CM | POA: Diagnosis not present

## 2016-07-26 DIAGNOSIS — R488 Other symbolic dysfunctions: Secondary | ICD-10-CM | POA: Diagnosis not present

## 2016-07-26 DIAGNOSIS — I10 Essential (primary) hypertension: Secondary | ICD-10-CM | POA: Diagnosis not present

## 2016-07-26 DIAGNOSIS — R262 Difficulty in walking, not elsewhere classified: Secondary | ICD-10-CM | POA: Diagnosis not present

## 2016-07-26 DIAGNOSIS — E039 Hypothyroidism, unspecified: Secondary | ICD-10-CM | POA: Diagnosis not present

## 2016-07-27 DIAGNOSIS — R488 Other symbolic dysfunctions: Secondary | ICD-10-CM | POA: Diagnosis not present

## 2016-07-27 DIAGNOSIS — I5030 Unspecified diastolic (congestive) heart failure: Secondary | ICD-10-CM | POA: Diagnosis not present

## 2016-07-27 DIAGNOSIS — E039 Hypothyroidism, unspecified: Secondary | ICD-10-CM | POA: Diagnosis not present

## 2016-07-27 DIAGNOSIS — K219 Gastro-esophageal reflux disease without esophagitis: Secondary | ICD-10-CM | POA: Diagnosis not present

## 2016-07-27 DIAGNOSIS — R2689 Other abnormalities of gait and mobility: Secondary | ICD-10-CM | POA: Diagnosis not present

## 2016-07-27 DIAGNOSIS — M25561 Pain in right knee: Secondary | ICD-10-CM | POA: Diagnosis not present

## 2016-07-27 DIAGNOSIS — R262 Difficulty in walking, not elsewhere classified: Secondary | ICD-10-CM | POA: Diagnosis not present

## 2016-07-27 DIAGNOSIS — M6281 Muscle weakness (generalized): Secondary | ICD-10-CM | POA: Diagnosis not present

## 2016-07-27 DIAGNOSIS — I4891 Unspecified atrial fibrillation: Secondary | ICD-10-CM | POA: Diagnosis not present

## 2016-07-27 DIAGNOSIS — M25569 Pain in unspecified knee: Secondary | ICD-10-CM | POA: Diagnosis not present

## 2016-07-27 DIAGNOSIS — R2681 Unsteadiness on feet: Secondary | ICD-10-CM | POA: Diagnosis not present

## 2016-07-27 DIAGNOSIS — I10 Essential (primary) hypertension: Secondary | ICD-10-CM | POA: Diagnosis not present

## 2016-07-29 DIAGNOSIS — M25569 Pain in unspecified knee: Secondary | ICD-10-CM | POA: Diagnosis not present

## 2016-07-29 DIAGNOSIS — R262 Difficulty in walking, not elsewhere classified: Secondary | ICD-10-CM | POA: Diagnosis not present

## 2016-07-29 DIAGNOSIS — I4891 Unspecified atrial fibrillation: Secondary | ICD-10-CM | POA: Diagnosis not present

## 2016-07-29 DIAGNOSIS — R488 Other symbolic dysfunctions: Secondary | ICD-10-CM | POA: Diagnosis not present

## 2016-07-29 DIAGNOSIS — R2689 Other abnormalities of gait and mobility: Secondary | ICD-10-CM | POA: Diagnosis not present

## 2016-07-29 DIAGNOSIS — K219 Gastro-esophageal reflux disease without esophagitis: Secondary | ICD-10-CM | POA: Diagnosis not present

## 2016-07-29 DIAGNOSIS — M6281 Muscle weakness (generalized): Secondary | ICD-10-CM | POA: Diagnosis not present

## 2016-07-29 DIAGNOSIS — I5030 Unspecified diastolic (congestive) heart failure: Secondary | ICD-10-CM | POA: Diagnosis not present

## 2016-07-29 DIAGNOSIS — I10 Essential (primary) hypertension: Secondary | ICD-10-CM | POA: Diagnosis not present

## 2016-07-29 DIAGNOSIS — E039 Hypothyroidism, unspecified: Secondary | ICD-10-CM | POA: Diagnosis not present

## 2016-07-29 DIAGNOSIS — R2681 Unsteadiness on feet: Secondary | ICD-10-CM | POA: Diagnosis not present

## 2016-07-29 DIAGNOSIS — M25561 Pain in right knee: Secondary | ICD-10-CM | POA: Diagnosis not present

## 2016-08-01 DIAGNOSIS — E039 Hypothyroidism, unspecified: Secondary | ICD-10-CM | POA: Diagnosis not present

## 2016-08-01 DIAGNOSIS — M25561 Pain in right knee: Secondary | ICD-10-CM | POA: Diagnosis not present

## 2016-08-01 DIAGNOSIS — R2689 Other abnormalities of gait and mobility: Secondary | ICD-10-CM | POA: Diagnosis not present

## 2016-08-01 DIAGNOSIS — I10 Essential (primary) hypertension: Secondary | ICD-10-CM | POA: Diagnosis not present

## 2016-08-01 DIAGNOSIS — I4891 Unspecified atrial fibrillation: Secondary | ICD-10-CM | POA: Diagnosis not present

## 2016-08-01 DIAGNOSIS — K219 Gastro-esophageal reflux disease without esophagitis: Secondary | ICD-10-CM | POA: Diagnosis not present

## 2016-08-01 DIAGNOSIS — R262 Difficulty in walking, not elsewhere classified: Secondary | ICD-10-CM | POA: Diagnosis not present

## 2016-08-01 DIAGNOSIS — M6281 Muscle weakness (generalized): Secondary | ICD-10-CM | POA: Diagnosis not present

## 2016-08-01 DIAGNOSIS — I5030 Unspecified diastolic (congestive) heart failure: Secondary | ICD-10-CM | POA: Diagnosis not present

## 2016-08-01 DIAGNOSIS — R488 Other symbolic dysfunctions: Secondary | ICD-10-CM | POA: Diagnosis not present

## 2016-08-01 DIAGNOSIS — R2681 Unsteadiness on feet: Secondary | ICD-10-CM | POA: Diagnosis not present

## 2016-08-01 DIAGNOSIS — M25569 Pain in unspecified knee: Secondary | ICD-10-CM | POA: Diagnosis not present

## 2016-08-05 DIAGNOSIS — I10 Essential (primary) hypertension: Secondary | ICD-10-CM | POA: Diagnosis not present

## 2016-08-05 DIAGNOSIS — M25561 Pain in right knee: Secondary | ICD-10-CM | POA: Diagnosis not present

## 2016-08-05 DIAGNOSIS — R262 Difficulty in walking, not elsewhere classified: Secondary | ICD-10-CM | POA: Diagnosis not present

## 2016-08-05 DIAGNOSIS — M6281 Muscle weakness (generalized): Secondary | ICD-10-CM | POA: Diagnosis not present

## 2016-08-05 DIAGNOSIS — M25569 Pain in unspecified knee: Secondary | ICD-10-CM | POA: Diagnosis not present

## 2016-08-05 DIAGNOSIS — I4891 Unspecified atrial fibrillation: Secondary | ICD-10-CM | POA: Diagnosis not present

## 2016-08-05 DIAGNOSIS — R488 Other symbolic dysfunctions: Secondary | ICD-10-CM | POA: Diagnosis not present

## 2016-08-05 DIAGNOSIS — R2689 Other abnormalities of gait and mobility: Secondary | ICD-10-CM | POA: Diagnosis not present

## 2016-08-05 DIAGNOSIS — I5030 Unspecified diastolic (congestive) heart failure: Secondary | ICD-10-CM | POA: Diagnosis not present

## 2016-08-05 DIAGNOSIS — K219 Gastro-esophageal reflux disease without esophagitis: Secondary | ICD-10-CM | POA: Diagnosis not present

## 2016-08-05 DIAGNOSIS — R2681 Unsteadiness on feet: Secondary | ICD-10-CM | POA: Diagnosis not present

## 2016-08-05 DIAGNOSIS — E039 Hypothyroidism, unspecified: Secondary | ICD-10-CM | POA: Diagnosis not present

## 2016-08-06 DIAGNOSIS — I10 Essential (primary) hypertension: Secondary | ICD-10-CM | POA: Diagnosis not present

## 2016-08-06 DIAGNOSIS — M1000 Idiopathic gout, unspecified site: Secondary | ICD-10-CM | POA: Diagnosis not present

## 2016-08-06 DIAGNOSIS — I4891 Unspecified atrial fibrillation: Secondary | ICD-10-CM | POA: Diagnosis not present

## 2016-08-08 DIAGNOSIS — E039 Hypothyroidism, unspecified: Secondary | ICD-10-CM | POA: Diagnosis not present

## 2016-08-08 DIAGNOSIS — M25561 Pain in right knee: Secondary | ICD-10-CM | POA: Diagnosis not present

## 2016-08-08 DIAGNOSIS — R488 Other symbolic dysfunctions: Secondary | ICD-10-CM | POA: Diagnosis not present

## 2016-08-08 DIAGNOSIS — I10 Essential (primary) hypertension: Secondary | ICD-10-CM | POA: Diagnosis not present

## 2016-08-08 DIAGNOSIS — K219 Gastro-esophageal reflux disease without esophagitis: Secondary | ICD-10-CM | POA: Diagnosis not present

## 2016-08-08 DIAGNOSIS — R262 Difficulty in walking, not elsewhere classified: Secondary | ICD-10-CM | POA: Diagnosis not present

## 2016-08-08 DIAGNOSIS — M6281 Muscle weakness (generalized): Secondary | ICD-10-CM | POA: Diagnosis not present

## 2016-08-08 DIAGNOSIS — M25569 Pain in unspecified knee: Secondary | ICD-10-CM | POA: Diagnosis not present

## 2016-08-08 DIAGNOSIS — I4891 Unspecified atrial fibrillation: Secondary | ICD-10-CM | POA: Diagnosis not present

## 2016-08-08 DIAGNOSIS — I5030 Unspecified diastolic (congestive) heart failure: Secondary | ICD-10-CM | POA: Diagnosis not present

## 2016-08-08 DIAGNOSIS — R2681 Unsteadiness on feet: Secondary | ICD-10-CM | POA: Diagnosis not present

## 2016-08-08 DIAGNOSIS — R2689 Other abnormalities of gait and mobility: Secondary | ICD-10-CM | POA: Diagnosis not present

## 2016-08-09 DIAGNOSIS — M25561 Pain in right knee: Secondary | ICD-10-CM | POA: Diagnosis not present

## 2016-08-09 DIAGNOSIS — R2689 Other abnormalities of gait and mobility: Secondary | ICD-10-CM | POA: Diagnosis not present

## 2016-08-09 DIAGNOSIS — R2681 Unsteadiness on feet: Secondary | ICD-10-CM | POA: Diagnosis not present

## 2016-08-09 DIAGNOSIS — E039 Hypothyroidism, unspecified: Secondary | ICD-10-CM | POA: Diagnosis not present

## 2016-08-09 DIAGNOSIS — I5030 Unspecified diastolic (congestive) heart failure: Secondary | ICD-10-CM | POA: Diagnosis not present

## 2016-08-09 DIAGNOSIS — R262 Difficulty in walking, not elsewhere classified: Secondary | ICD-10-CM | POA: Diagnosis not present

## 2016-08-09 DIAGNOSIS — I4891 Unspecified atrial fibrillation: Secondary | ICD-10-CM | POA: Diagnosis not present

## 2016-08-09 DIAGNOSIS — M25569 Pain in unspecified knee: Secondary | ICD-10-CM | POA: Diagnosis not present

## 2016-08-09 DIAGNOSIS — K219 Gastro-esophageal reflux disease without esophagitis: Secondary | ICD-10-CM | POA: Diagnosis not present

## 2016-08-09 DIAGNOSIS — R488 Other symbolic dysfunctions: Secondary | ICD-10-CM | POA: Diagnosis not present

## 2016-08-09 DIAGNOSIS — M6281 Muscle weakness (generalized): Secondary | ICD-10-CM | POA: Diagnosis not present

## 2016-08-09 DIAGNOSIS — I10 Essential (primary) hypertension: Secondary | ICD-10-CM | POA: Diagnosis not present

## 2016-08-10 DIAGNOSIS — R488 Other symbolic dysfunctions: Secondary | ICD-10-CM | POA: Diagnosis not present

## 2016-08-10 DIAGNOSIS — R2689 Other abnormalities of gait and mobility: Secondary | ICD-10-CM | POA: Diagnosis not present

## 2016-08-10 DIAGNOSIS — E039 Hypothyroidism, unspecified: Secondary | ICD-10-CM | POA: Diagnosis not present

## 2016-08-10 DIAGNOSIS — I5030 Unspecified diastolic (congestive) heart failure: Secondary | ICD-10-CM | POA: Diagnosis not present

## 2016-08-10 DIAGNOSIS — R262 Difficulty in walking, not elsewhere classified: Secondary | ICD-10-CM | POA: Diagnosis not present

## 2016-08-10 DIAGNOSIS — R2681 Unsteadiness on feet: Secondary | ICD-10-CM | POA: Diagnosis not present

## 2016-08-10 DIAGNOSIS — I10 Essential (primary) hypertension: Secondary | ICD-10-CM | POA: Diagnosis not present

## 2016-08-10 DIAGNOSIS — I4891 Unspecified atrial fibrillation: Secondary | ICD-10-CM | POA: Diagnosis not present

## 2016-08-10 DIAGNOSIS — M6281 Muscle weakness (generalized): Secondary | ICD-10-CM | POA: Diagnosis not present

## 2016-08-10 DIAGNOSIS — M25569 Pain in unspecified knee: Secondary | ICD-10-CM | POA: Diagnosis not present

## 2016-08-10 DIAGNOSIS — M25561 Pain in right knee: Secondary | ICD-10-CM | POA: Diagnosis not present

## 2016-08-10 DIAGNOSIS — K219 Gastro-esophageal reflux disease without esophagitis: Secondary | ICD-10-CM | POA: Diagnosis not present

## 2016-08-11 DIAGNOSIS — M6281 Muscle weakness (generalized): Secondary | ICD-10-CM | POA: Diagnosis not present

## 2016-08-11 DIAGNOSIS — E039 Hypothyroidism, unspecified: Secondary | ICD-10-CM | POA: Diagnosis not present

## 2016-08-11 DIAGNOSIS — M25561 Pain in right knee: Secondary | ICD-10-CM | POA: Diagnosis not present

## 2016-08-11 DIAGNOSIS — K219 Gastro-esophageal reflux disease without esophagitis: Secondary | ICD-10-CM | POA: Diagnosis not present

## 2016-08-11 DIAGNOSIS — I10 Essential (primary) hypertension: Secondary | ICD-10-CM | POA: Diagnosis not present

## 2016-08-11 DIAGNOSIS — R2681 Unsteadiness on feet: Secondary | ICD-10-CM | POA: Diagnosis not present

## 2016-08-11 DIAGNOSIS — R488 Other symbolic dysfunctions: Secondary | ICD-10-CM | POA: Diagnosis not present

## 2016-08-11 DIAGNOSIS — R262 Difficulty in walking, not elsewhere classified: Secondary | ICD-10-CM | POA: Diagnosis not present

## 2016-08-11 DIAGNOSIS — I4891 Unspecified atrial fibrillation: Secondary | ICD-10-CM | POA: Diagnosis not present

## 2016-08-11 DIAGNOSIS — R2689 Other abnormalities of gait and mobility: Secondary | ICD-10-CM | POA: Diagnosis not present

## 2016-08-11 DIAGNOSIS — I5030 Unspecified diastolic (congestive) heart failure: Secondary | ICD-10-CM | POA: Diagnosis not present

## 2016-08-11 DIAGNOSIS — M25569 Pain in unspecified knee: Secondary | ICD-10-CM | POA: Diagnosis not present

## 2016-08-15 DIAGNOSIS — I10 Essential (primary) hypertension: Secondary | ICD-10-CM | POA: Diagnosis not present

## 2016-08-15 DIAGNOSIS — K219 Gastro-esophageal reflux disease without esophagitis: Secondary | ICD-10-CM | POA: Diagnosis not present

## 2016-08-15 DIAGNOSIS — M6281 Muscle weakness (generalized): Secondary | ICD-10-CM | POA: Diagnosis not present

## 2016-08-15 DIAGNOSIS — M25561 Pain in right knee: Secondary | ICD-10-CM | POA: Diagnosis not present

## 2016-08-15 DIAGNOSIS — M25569 Pain in unspecified knee: Secondary | ICD-10-CM | POA: Diagnosis not present

## 2016-08-15 DIAGNOSIS — R488 Other symbolic dysfunctions: Secondary | ICD-10-CM | POA: Diagnosis not present

## 2016-08-15 DIAGNOSIS — R2681 Unsteadiness on feet: Secondary | ICD-10-CM | POA: Diagnosis not present

## 2016-08-15 DIAGNOSIS — I5030 Unspecified diastolic (congestive) heart failure: Secondary | ICD-10-CM | POA: Diagnosis not present

## 2016-08-15 DIAGNOSIS — R2689 Other abnormalities of gait and mobility: Secondary | ICD-10-CM | POA: Diagnosis not present

## 2016-08-15 DIAGNOSIS — R262 Difficulty in walking, not elsewhere classified: Secondary | ICD-10-CM | POA: Diagnosis not present

## 2016-08-15 DIAGNOSIS — E039 Hypothyroidism, unspecified: Secondary | ICD-10-CM | POA: Diagnosis not present

## 2016-08-15 DIAGNOSIS — I4891 Unspecified atrial fibrillation: Secondary | ICD-10-CM | POA: Diagnosis not present

## 2016-08-17 DIAGNOSIS — I4891 Unspecified atrial fibrillation: Secondary | ICD-10-CM | POA: Diagnosis not present

## 2016-08-17 DIAGNOSIS — I5030 Unspecified diastolic (congestive) heart failure: Secondary | ICD-10-CM | POA: Diagnosis not present

## 2016-08-17 DIAGNOSIS — R2681 Unsteadiness on feet: Secondary | ICD-10-CM | POA: Diagnosis not present

## 2016-08-17 DIAGNOSIS — R262 Difficulty in walking, not elsewhere classified: Secondary | ICD-10-CM | POA: Diagnosis not present

## 2016-08-17 DIAGNOSIS — R488 Other symbolic dysfunctions: Secondary | ICD-10-CM | POA: Diagnosis not present

## 2016-08-17 DIAGNOSIS — I10 Essential (primary) hypertension: Secondary | ICD-10-CM | POA: Diagnosis not present

## 2016-08-17 DIAGNOSIS — K219 Gastro-esophageal reflux disease without esophagitis: Secondary | ICD-10-CM | POA: Diagnosis not present

## 2016-08-17 DIAGNOSIS — M25569 Pain in unspecified knee: Secondary | ICD-10-CM | POA: Diagnosis not present

## 2016-08-17 DIAGNOSIS — R2689 Other abnormalities of gait and mobility: Secondary | ICD-10-CM | POA: Diagnosis not present

## 2016-08-17 DIAGNOSIS — E039 Hypothyroidism, unspecified: Secondary | ICD-10-CM | POA: Diagnosis not present

## 2016-08-17 DIAGNOSIS — M25561 Pain in right knee: Secondary | ICD-10-CM | POA: Diagnosis not present

## 2016-08-17 DIAGNOSIS — M6281 Muscle weakness (generalized): Secondary | ICD-10-CM | POA: Diagnosis not present

## 2016-08-19 DIAGNOSIS — R262 Difficulty in walking, not elsewhere classified: Secondary | ICD-10-CM | POA: Diagnosis not present

## 2016-08-19 DIAGNOSIS — R488 Other symbolic dysfunctions: Secondary | ICD-10-CM | POA: Diagnosis not present

## 2016-08-19 DIAGNOSIS — I10 Essential (primary) hypertension: Secondary | ICD-10-CM | POA: Diagnosis not present

## 2016-08-19 DIAGNOSIS — R2681 Unsteadiness on feet: Secondary | ICD-10-CM | POA: Diagnosis not present

## 2016-08-19 DIAGNOSIS — K219 Gastro-esophageal reflux disease without esophagitis: Secondary | ICD-10-CM | POA: Diagnosis not present

## 2016-08-19 DIAGNOSIS — M25561 Pain in right knee: Secondary | ICD-10-CM | POA: Diagnosis not present

## 2016-08-19 DIAGNOSIS — M25569 Pain in unspecified knee: Secondary | ICD-10-CM | POA: Diagnosis not present

## 2016-08-19 DIAGNOSIS — M6281 Muscle weakness (generalized): Secondary | ICD-10-CM | POA: Diagnosis not present

## 2016-08-19 DIAGNOSIS — R2689 Other abnormalities of gait and mobility: Secondary | ICD-10-CM | POA: Diagnosis not present

## 2016-08-19 DIAGNOSIS — I5030 Unspecified diastolic (congestive) heart failure: Secondary | ICD-10-CM | POA: Diagnosis not present

## 2016-08-19 DIAGNOSIS — I4891 Unspecified atrial fibrillation: Secondary | ICD-10-CM | POA: Diagnosis not present

## 2016-08-19 DIAGNOSIS — E039 Hypothyroidism, unspecified: Secondary | ICD-10-CM | POA: Diagnosis not present

## 2016-08-22 DIAGNOSIS — M25561 Pain in right knee: Secondary | ICD-10-CM | POA: Diagnosis not present

## 2016-08-22 DIAGNOSIS — R2681 Unsteadiness on feet: Secondary | ICD-10-CM | POA: Diagnosis not present

## 2016-08-22 DIAGNOSIS — I5030 Unspecified diastolic (congestive) heart failure: Secondary | ICD-10-CM | POA: Diagnosis not present

## 2016-08-22 DIAGNOSIS — R262 Difficulty in walking, not elsewhere classified: Secondary | ICD-10-CM | POA: Diagnosis not present

## 2016-08-22 DIAGNOSIS — M25569 Pain in unspecified knee: Secondary | ICD-10-CM | POA: Diagnosis not present

## 2016-08-22 DIAGNOSIS — E039 Hypothyroidism, unspecified: Secondary | ICD-10-CM | POA: Diagnosis not present

## 2016-08-22 DIAGNOSIS — I10 Essential (primary) hypertension: Secondary | ICD-10-CM | POA: Diagnosis not present

## 2016-08-22 DIAGNOSIS — K219 Gastro-esophageal reflux disease without esophagitis: Secondary | ICD-10-CM | POA: Diagnosis not present

## 2016-08-22 DIAGNOSIS — M6281 Muscle weakness (generalized): Secondary | ICD-10-CM | POA: Diagnosis not present

## 2016-08-22 DIAGNOSIS — R2689 Other abnormalities of gait and mobility: Secondary | ICD-10-CM | POA: Diagnosis not present

## 2016-08-22 DIAGNOSIS — I4891 Unspecified atrial fibrillation: Secondary | ICD-10-CM | POA: Diagnosis not present

## 2016-08-22 DIAGNOSIS — R488 Other symbolic dysfunctions: Secondary | ICD-10-CM | POA: Diagnosis not present

## 2016-08-23 DIAGNOSIS — I10 Essential (primary) hypertension: Secondary | ICD-10-CM | POA: Diagnosis not present

## 2016-08-23 DIAGNOSIS — R2689 Other abnormalities of gait and mobility: Secondary | ICD-10-CM | POA: Diagnosis not present

## 2016-08-23 DIAGNOSIS — M25561 Pain in right knee: Secondary | ICD-10-CM | POA: Diagnosis not present

## 2016-08-23 DIAGNOSIS — K219 Gastro-esophageal reflux disease without esophagitis: Secondary | ICD-10-CM | POA: Diagnosis not present

## 2016-08-23 DIAGNOSIS — E039 Hypothyroidism, unspecified: Secondary | ICD-10-CM | POA: Diagnosis not present

## 2016-08-23 DIAGNOSIS — R262 Difficulty in walking, not elsewhere classified: Secondary | ICD-10-CM | POA: Diagnosis not present

## 2016-08-23 DIAGNOSIS — R2681 Unsteadiness on feet: Secondary | ICD-10-CM | POA: Diagnosis not present

## 2016-08-23 DIAGNOSIS — I4891 Unspecified atrial fibrillation: Secondary | ICD-10-CM | POA: Diagnosis not present

## 2016-08-23 DIAGNOSIS — R488 Other symbolic dysfunctions: Secondary | ICD-10-CM | POA: Diagnosis not present

## 2016-08-23 DIAGNOSIS — M25569 Pain in unspecified knee: Secondary | ICD-10-CM | POA: Diagnosis not present

## 2016-08-23 DIAGNOSIS — M6281 Muscle weakness (generalized): Secondary | ICD-10-CM | POA: Diagnosis not present

## 2016-08-23 DIAGNOSIS — I5030 Unspecified diastolic (congestive) heart failure: Secondary | ICD-10-CM | POA: Diagnosis not present

## 2016-08-24 DIAGNOSIS — R2689 Other abnormalities of gait and mobility: Secondary | ICD-10-CM | POA: Diagnosis not present

## 2016-08-24 DIAGNOSIS — I4891 Unspecified atrial fibrillation: Secondary | ICD-10-CM | POA: Diagnosis not present

## 2016-08-24 DIAGNOSIS — R262 Difficulty in walking, not elsewhere classified: Secondary | ICD-10-CM | POA: Diagnosis not present

## 2016-08-24 DIAGNOSIS — I5030 Unspecified diastolic (congestive) heart failure: Secondary | ICD-10-CM | POA: Diagnosis not present

## 2016-08-24 DIAGNOSIS — K219 Gastro-esophageal reflux disease without esophagitis: Secondary | ICD-10-CM | POA: Diagnosis not present

## 2016-08-24 DIAGNOSIS — R2681 Unsteadiness on feet: Secondary | ICD-10-CM | POA: Diagnosis not present

## 2016-08-24 DIAGNOSIS — R488 Other symbolic dysfunctions: Secondary | ICD-10-CM | POA: Diagnosis not present

## 2016-08-24 DIAGNOSIS — M6281 Muscle weakness (generalized): Secondary | ICD-10-CM | POA: Diagnosis not present

## 2016-08-24 DIAGNOSIS — E039 Hypothyroidism, unspecified: Secondary | ICD-10-CM | POA: Diagnosis not present

## 2016-08-24 DIAGNOSIS — I10 Essential (primary) hypertension: Secondary | ICD-10-CM | POA: Diagnosis not present

## 2016-08-24 DIAGNOSIS — M25569 Pain in unspecified knee: Secondary | ICD-10-CM | POA: Diagnosis not present

## 2016-08-24 DIAGNOSIS — M25561 Pain in right knee: Secondary | ICD-10-CM | POA: Diagnosis not present

## 2016-08-25 DIAGNOSIS — E039 Hypothyroidism, unspecified: Secondary | ICD-10-CM | POA: Diagnosis not present

## 2016-08-25 DIAGNOSIS — K219 Gastro-esophageal reflux disease without esophagitis: Secondary | ICD-10-CM | POA: Diagnosis not present

## 2016-08-25 DIAGNOSIS — M25569 Pain in unspecified knee: Secondary | ICD-10-CM | POA: Diagnosis not present

## 2016-08-25 DIAGNOSIS — R2681 Unsteadiness on feet: Secondary | ICD-10-CM | POA: Diagnosis not present

## 2016-08-25 DIAGNOSIS — R262 Difficulty in walking, not elsewhere classified: Secondary | ICD-10-CM | POA: Diagnosis not present

## 2016-08-25 DIAGNOSIS — R2689 Other abnormalities of gait and mobility: Secondary | ICD-10-CM | POA: Diagnosis not present

## 2016-08-25 DIAGNOSIS — M25561 Pain in right knee: Secondary | ICD-10-CM | POA: Diagnosis not present

## 2016-08-25 DIAGNOSIS — M6281 Muscle weakness (generalized): Secondary | ICD-10-CM | POA: Diagnosis not present

## 2016-08-25 DIAGNOSIS — I10 Essential (primary) hypertension: Secondary | ICD-10-CM | POA: Diagnosis not present

## 2016-08-25 DIAGNOSIS — I5030 Unspecified diastolic (congestive) heart failure: Secondary | ICD-10-CM | POA: Diagnosis not present

## 2016-08-25 DIAGNOSIS — R488 Other symbolic dysfunctions: Secondary | ICD-10-CM | POA: Diagnosis not present

## 2016-08-25 DIAGNOSIS — I4891 Unspecified atrial fibrillation: Secondary | ICD-10-CM | POA: Diagnosis not present

## 2016-08-26 DIAGNOSIS — R488 Other symbolic dysfunctions: Secondary | ICD-10-CM | POA: Diagnosis not present

## 2016-08-26 DIAGNOSIS — E039 Hypothyroidism, unspecified: Secondary | ICD-10-CM | POA: Diagnosis not present

## 2016-08-26 DIAGNOSIS — K219 Gastro-esophageal reflux disease without esophagitis: Secondary | ICD-10-CM | POA: Diagnosis not present

## 2016-08-26 DIAGNOSIS — I4891 Unspecified atrial fibrillation: Secondary | ICD-10-CM | POA: Diagnosis not present

## 2016-08-26 DIAGNOSIS — M25561 Pain in right knee: Secondary | ICD-10-CM | POA: Diagnosis not present

## 2016-08-26 DIAGNOSIS — R2681 Unsteadiness on feet: Secondary | ICD-10-CM | POA: Diagnosis not present

## 2016-08-26 DIAGNOSIS — M25569 Pain in unspecified knee: Secondary | ICD-10-CM | POA: Diagnosis not present

## 2016-08-26 DIAGNOSIS — R2689 Other abnormalities of gait and mobility: Secondary | ICD-10-CM | POA: Diagnosis not present

## 2016-08-26 DIAGNOSIS — M6281 Muscle weakness (generalized): Secondary | ICD-10-CM | POA: Diagnosis not present

## 2016-08-26 DIAGNOSIS — I10 Essential (primary) hypertension: Secondary | ICD-10-CM | POA: Diagnosis not present

## 2016-08-26 DIAGNOSIS — I5030 Unspecified diastolic (congestive) heart failure: Secondary | ICD-10-CM | POA: Diagnosis not present

## 2016-08-26 DIAGNOSIS — R262 Difficulty in walking, not elsewhere classified: Secondary | ICD-10-CM | POA: Diagnosis not present

## 2016-08-29 DIAGNOSIS — R488 Other symbolic dysfunctions: Secondary | ICD-10-CM | POA: Diagnosis not present

## 2016-08-29 DIAGNOSIS — M25561 Pain in right knee: Secondary | ICD-10-CM | POA: Diagnosis not present

## 2016-08-29 DIAGNOSIS — R2689 Other abnormalities of gait and mobility: Secondary | ICD-10-CM | POA: Diagnosis not present

## 2016-08-29 DIAGNOSIS — M25569 Pain in unspecified knee: Secondary | ICD-10-CM | POA: Diagnosis not present

## 2016-08-29 DIAGNOSIS — I5030 Unspecified diastolic (congestive) heart failure: Secondary | ICD-10-CM | POA: Diagnosis not present

## 2016-08-29 DIAGNOSIS — I4891 Unspecified atrial fibrillation: Secondary | ICD-10-CM | POA: Diagnosis not present

## 2016-08-29 DIAGNOSIS — R262 Difficulty in walking, not elsewhere classified: Secondary | ICD-10-CM | POA: Diagnosis not present

## 2016-08-29 DIAGNOSIS — M6281 Muscle weakness (generalized): Secondary | ICD-10-CM | POA: Diagnosis not present

## 2016-08-29 DIAGNOSIS — R2681 Unsteadiness on feet: Secondary | ICD-10-CM | POA: Diagnosis not present

## 2016-08-29 DIAGNOSIS — E039 Hypothyroidism, unspecified: Secondary | ICD-10-CM | POA: Diagnosis not present

## 2016-08-29 DIAGNOSIS — I10 Essential (primary) hypertension: Secondary | ICD-10-CM | POA: Diagnosis not present

## 2016-08-29 DIAGNOSIS — K219 Gastro-esophageal reflux disease without esophagitis: Secondary | ICD-10-CM | POA: Diagnosis not present

## 2016-08-30 DIAGNOSIS — M25561 Pain in right knee: Secondary | ICD-10-CM | POA: Diagnosis not present

## 2016-08-30 DIAGNOSIS — R262 Difficulty in walking, not elsewhere classified: Secondary | ICD-10-CM | POA: Diagnosis not present

## 2016-08-30 DIAGNOSIS — K219 Gastro-esophageal reflux disease without esophagitis: Secondary | ICD-10-CM | POA: Diagnosis not present

## 2016-08-30 DIAGNOSIS — I10 Essential (primary) hypertension: Secondary | ICD-10-CM | POA: Diagnosis not present

## 2016-08-30 DIAGNOSIS — E039 Hypothyroidism, unspecified: Secondary | ICD-10-CM | POA: Diagnosis not present

## 2016-08-30 DIAGNOSIS — M6281 Muscle weakness (generalized): Secondary | ICD-10-CM | POA: Diagnosis not present

## 2016-08-30 DIAGNOSIS — R488 Other symbolic dysfunctions: Secondary | ICD-10-CM | POA: Diagnosis not present

## 2016-08-30 DIAGNOSIS — R2681 Unsteadiness on feet: Secondary | ICD-10-CM | POA: Diagnosis not present

## 2016-08-30 DIAGNOSIS — R2689 Other abnormalities of gait and mobility: Secondary | ICD-10-CM | POA: Diagnosis not present

## 2016-08-30 DIAGNOSIS — I4891 Unspecified atrial fibrillation: Secondary | ICD-10-CM | POA: Diagnosis not present

## 2016-08-30 DIAGNOSIS — M25569 Pain in unspecified knee: Secondary | ICD-10-CM | POA: Diagnosis not present

## 2016-08-30 DIAGNOSIS — I5030 Unspecified diastolic (congestive) heart failure: Secondary | ICD-10-CM | POA: Diagnosis not present

## 2016-08-31 DIAGNOSIS — I4891 Unspecified atrial fibrillation: Secondary | ICD-10-CM | POA: Diagnosis not present

## 2016-08-31 DIAGNOSIS — I5030 Unspecified diastolic (congestive) heart failure: Secondary | ICD-10-CM | POA: Diagnosis not present

## 2016-08-31 DIAGNOSIS — I10 Essential (primary) hypertension: Secondary | ICD-10-CM | POA: Diagnosis not present

## 2016-08-31 DIAGNOSIS — M25569 Pain in unspecified knee: Secondary | ICD-10-CM | POA: Diagnosis not present

## 2016-08-31 DIAGNOSIS — R2689 Other abnormalities of gait and mobility: Secondary | ICD-10-CM | POA: Diagnosis not present

## 2016-08-31 DIAGNOSIS — R262 Difficulty in walking, not elsewhere classified: Secondary | ICD-10-CM | POA: Diagnosis not present

## 2016-08-31 DIAGNOSIS — R488 Other symbolic dysfunctions: Secondary | ICD-10-CM | POA: Diagnosis not present

## 2016-08-31 DIAGNOSIS — R2681 Unsteadiness on feet: Secondary | ICD-10-CM | POA: Diagnosis not present

## 2016-08-31 DIAGNOSIS — M6281 Muscle weakness (generalized): Secondary | ICD-10-CM | POA: Diagnosis not present

## 2016-08-31 DIAGNOSIS — M25561 Pain in right knee: Secondary | ICD-10-CM | POA: Diagnosis not present

## 2016-08-31 DIAGNOSIS — E039 Hypothyroidism, unspecified: Secondary | ICD-10-CM | POA: Diagnosis not present

## 2016-08-31 DIAGNOSIS — K219 Gastro-esophageal reflux disease without esophagitis: Secondary | ICD-10-CM | POA: Diagnosis not present

## 2016-09-01 DIAGNOSIS — E039 Hypothyroidism, unspecified: Secondary | ICD-10-CM | POA: Diagnosis not present

## 2016-09-01 DIAGNOSIS — K219 Gastro-esophageal reflux disease without esophagitis: Secondary | ICD-10-CM | POA: Diagnosis not present

## 2016-09-01 DIAGNOSIS — R2681 Unsteadiness on feet: Secondary | ICD-10-CM | POA: Diagnosis not present

## 2016-09-01 DIAGNOSIS — I10 Essential (primary) hypertension: Secondary | ICD-10-CM | POA: Diagnosis not present

## 2016-09-01 DIAGNOSIS — M25569 Pain in unspecified knee: Secondary | ICD-10-CM | POA: Diagnosis not present

## 2016-09-01 DIAGNOSIS — M25561 Pain in right knee: Secondary | ICD-10-CM | POA: Diagnosis not present

## 2016-09-01 DIAGNOSIS — R488 Other symbolic dysfunctions: Secondary | ICD-10-CM | POA: Diagnosis not present

## 2016-09-01 DIAGNOSIS — M6281 Muscle weakness (generalized): Secondary | ICD-10-CM | POA: Diagnosis not present

## 2016-09-01 DIAGNOSIS — I4891 Unspecified atrial fibrillation: Secondary | ICD-10-CM | POA: Diagnosis not present

## 2016-09-01 DIAGNOSIS — R2689 Other abnormalities of gait and mobility: Secondary | ICD-10-CM | POA: Diagnosis not present

## 2016-09-01 DIAGNOSIS — I5030 Unspecified diastolic (congestive) heart failure: Secondary | ICD-10-CM | POA: Diagnosis not present

## 2016-09-01 DIAGNOSIS — R262 Difficulty in walking, not elsewhere classified: Secondary | ICD-10-CM | POA: Diagnosis not present

## 2016-09-02 DIAGNOSIS — R2681 Unsteadiness on feet: Secondary | ICD-10-CM | POA: Diagnosis not present

## 2016-09-02 DIAGNOSIS — R262 Difficulty in walking, not elsewhere classified: Secondary | ICD-10-CM | POA: Diagnosis not present

## 2016-09-02 DIAGNOSIS — K219 Gastro-esophageal reflux disease without esophagitis: Secondary | ICD-10-CM | POA: Diagnosis not present

## 2016-09-02 DIAGNOSIS — R488 Other symbolic dysfunctions: Secondary | ICD-10-CM | POA: Diagnosis not present

## 2016-09-02 DIAGNOSIS — M25561 Pain in right knee: Secondary | ICD-10-CM | POA: Diagnosis not present

## 2016-09-02 DIAGNOSIS — M6281 Muscle weakness (generalized): Secondary | ICD-10-CM | POA: Diagnosis not present

## 2016-09-02 DIAGNOSIS — R2689 Other abnormalities of gait and mobility: Secondary | ICD-10-CM | POA: Diagnosis not present

## 2016-09-02 DIAGNOSIS — M25569 Pain in unspecified knee: Secondary | ICD-10-CM | POA: Diagnosis not present

## 2016-09-02 DIAGNOSIS — I5030 Unspecified diastolic (congestive) heart failure: Secondary | ICD-10-CM | POA: Diagnosis not present

## 2016-09-02 DIAGNOSIS — I10 Essential (primary) hypertension: Secondary | ICD-10-CM | POA: Diagnosis not present

## 2016-09-02 DIAGNOSIS — I4891 Unspecified atrial fibrillation: Secondary | ICD-10-CM | POA: Diagnosis not present

## 2016-09-02 DIAGNOSIS — E039 Hypothyroidism, unspecified: Secondary | ICD-10-CM | POA: Diagnosis not present

## 2016-09-05 DIAGNOSIS — I5030 Unspecified diastolic (congestive) heart failure: Secondary | ICD-10-CM | POA: Diagnosis not present

## 2016-09-05 DIAGNOSIS — I4891 Unspecified atrial fibrillation: Secondary | ICD-10-CM | POA: Diagnosis not present

## 2016-09-05 DIAGNOSIS — R2689 Other abnormalities of gait and mobility: Secondary | ICD-10-CM | POA: Diagnosis not present

## 2016-09-05 DIAGNOSIS — R2681 Unsteadiness on feet: Secondary | ICD-10-CM | POA: Diagnosis not present

## 2016-09-05 DIAGNOSIS — M25569 Pain in unspecified knee: Secondary | ICD-10-CM | POA: Diagnosis not present

## 2016-09-05 DIAGNOSIS — M6281 Muscle weakness (generalized): Secondary | ICD-10-CM | POA: Diagnosis not present

## 2016-09-05 DIAGNOSIS — I10 Essential (primary) hypertension: Secondary | ICD-10-CM | POA: Diagnosis not present

## 2016-09-05 DIAGNOSIS — R262 Difficulty in walking, not elsewhere classified: Secondary | ICD-10-CM | POA: Diagnosis not present

## 2016-09-05 DIAGNOSIS — K219 Gastro-esophageal reflux disease without esophagitis: Secondary | ICD-10-CM | POA: Diagnosis not present

## 2016-09-05 DIAGNOSIS — M25561 Pain in right knee: Secondary | ICD-10-CM | POA: Diagnosis not present

## 2016-09-05 DIAGNOSIS — R488 Other symbolic dysfunctions: Secondary | ICD-10-CM | POA: Diagnosis not present

## 2016-09-05 DIAGNOSIS — E039 Hypothyroidism, unspecified: Secondary | ICD-10-CM | POA: Diagnosis not present

## 2016-09-06 DIAGNOSIS — I4891 Unspecified atrial fibrillation: Secondary | ICD-10-CM | POA: Diagnosis not present

## 2016-09-06 DIAGNOSIS — E039 Hypothyroidism, unspecified: Secondary | ICD-10-CM | POA: Diagnosis not present

## 2016-09-06 DIAGNOSIS — R2689 Other abnormalities of gait and mobility: Secondary | ICD-10-CM | POA: Diagnosis not present

## 2016-09-06 DIAGNOSIS — M25561 Pain in right knee: Secondary | ICD-10-CM | POA: Diagnosis not present

## 2016-09-06 DIAGNOSIS — I5030 Unspecified diastolic (congestive) heart failure: Secondary | ICD-10-CM | POA: Diagnosis not present

## 2016-09-06 DIAGNOSIS — R262 Difficulty in walking, not elsewhere classified: Secondary | ICD-10-CM | POA: Diagnosis not present

## 2016-09-06 DIAGNOSIS — K219 Gastro-esophageal reflux disease without esophagitis: Secondary | ICD-10-CM | POA: Diagnosis not present

## 2016-09-06 DIAGNOSIS — M6281 Muscle weakness (generalized): Secondary | ICD-10-CM | POA: Diagnosis not present

## 2016-09-06 DIAGNOSIS — R488 Other symbolic dysfunctions: Secondary | ICD-10-CM | POA: Diagnosis not present

## 2016-09-06 DIAGNOSIS — R2681 Unsteadiness on feet: Secondary | ICD-10-CM | POA: Diagnosis not present

## 2016-09-06 DIAGNOSIS — I10 Essential (primary) hypertension: Secondary | ICD-10-CM | POA: Diagnosis not present

## 2016-09-06 DIAGNOSIS — M25569 Pain in unspecified knee: Secondary | ICD-10-CM | POA: Diagnosis not present

## 2016-09-07 DIAGNOSIS — M25561 Pain in right knee: Secondary | ICD-10-CM | POA: Diagnosis not present

## 2016-09-07 DIAGNOSIS — E039 Hypothyroidism, unspecified: Secondary | ICD-10-CM | POA: Diagnosis not present

## 2016-09-07 DIAGNOSIS — I5030 Unspecified diastolic (congestive) heart failure: Secondary | ICD-10-CM | POA: Diagnosis not present

## 2016-09-07 DIAGNOSIS — R2681 Unsteadiness on feet: Secondary | ICD-10-CM | POA: Diagnosis not present

## 2016-09-07 DIAGNOSIS — I4891 Unspecified atrial fibrillation: Secondary | ICD-10-CM | POA: Diagnosis not present

## 2016-09-07 DIAGNOSIS — M6281 Muscle weakness (generalized): Secondary | ICD-10-CM | POA: Diagnosis not present

## 2016-09-07 DIAGNOSIS — R262 Difficulty in walking, not elsewhere classified: Secondary | ICD-10-CM | POA: Diagnosis not present

## 2016-09-07 DIAGNOSIS — K219 Gastro-esophageal reflux disease without esophagitis: Secondary | ICD-10-CM | POA: Diagnosis not present

## 2016-09-07 DIAGNOSIS — R2689 Other abnormalities of gait and mobility: Secondary | ICD-10-CM | POA: Diagnosis not present

## 2016-09-07 DIAGNOSIS — I10 Essential (primary) hypertension: Secondary | ICD-10-CM | POA: Diagnosis not present

## 2016-09-07 DIAGNOSIS — R488 Other symbolic dysfunctions: Secondary | ICD-10-CM | POA: Diagnosis not present

## 2016-09-07 DIAGNOSIS — M25569 Pain in unspecified knee: Secondary | ICD-10-CM | POA: Diagnosis not present

## 2016-09-10 DIAGNOSIS — I4891 Unspecified atrial fibrillation: Secondary | ICD-10-CM | POA: Diagnosis not present

## 2016-09-10 DIAGNOSIS — I5030 Unspecified diastolic (congestive) heart failure: Secondary | ICD-10-CM | POA: Diagnosis not present

## 2016-09-10 DIAGNOSIS — M1000 Idiopathic gout, unspecified site: Secondary | ICD-10-CM | POA: Diagnosis not present

## 2016-09-28 DIAGNOSIS — Z79899 Other long term (current) drug therapy: Secondary | ICD-10-CM | POA: Diagnosis not present

## 2016-10-15 DIAGNOSIS — Z79899 Other long term (current) drug therapy: Secondary | ICD-10-CM | POA: Diagnosis not present

## 2016-10-23 DIAGNOSIS — I5030 Unspecified diastolic (congestive) heart failure: Secondary | ICD-10-CM | POA: Diagnosis not present

## 2016-10-23 DIAGNOSIS — I10 Essential (primary) hypertension: Secondary | ICD-10-CM | POA: Diagnosis not present

## 2016-10-23 DIAGNOSIS — I4891 Unspecified atrial fibrillation: Secondary | ICD-10-CM | POA: Diagnosis not present

## 2016-11-01 DIAGNOSIS — I5030 Unspecified diastolic (congestive) heart failure: Secondary | ICD-10-CM | POA: Diagnosis not present

## 2016-11-04 DIAGNOSIS — Z79899 Other long term (current) drug therapy: Secondary | ICD-10-CM | POA: Diagnosis not present

## 2016-11-22 DIAGNOSIS — Z1389 Encounter for screening for other disorder: Secondary | ICD-10-CM | POA: Diagnosis not present

## 2016-11-22 DIAGNOSIS — Z139 Encounter for screening, unspecified: Secondary | ICD-10-CM | POA: Diagnosis not present

## 2016-11-22 DIAGNOSIS — I10 Essential (primary) hypertension: Secondary | ICD-10-CM | POA: Diagnosis not present

## 2016-11-22 DIAGNOSIS — I5030 Unspecified diastolic (congestive) heart failure: Secondary | ICD-10-CM | POA: Diagnosis not present

## 2016-11-22 DIAGNOSIS — I4891 Unspecified atrial fibrillation: Secondary | ICD-10-CM | POA: Diagnosis not present

## 2016-11-22 DIAGNOSIS — Z0000 Encounter for general adult medical examination without abnormal findings: Secondary | ICD-10-CM | POA: Diagnosis not present

## 2016-12-02 DIAGNOSIS — I4891 Unspecified atrial fibrillation: Secondary | ICD-10-CM | POA: Diagnosis not present

## 2016-12-02 DIAGNOSIS — I5030 Unspecified diastolic (congestive) heart failure: Secondary | ICD-10-CM | POA: Diagnosis not present

## 2016-12-02 DIAGNOSIS — M109 Gout, unspecified: Secondary | ICD-10-CM | POA: Diagnosis not present

## 2017-01-08 DIAGNOSIS — I4891 Unspecified atrial fibrillation: Secondary | ICD-10-CM | POA: Diagnosis not present

## 2017-01-08 DIAGNOSIS — M109 Gout, unspecified: Secondary | ICD-10-CM | POA: Diagnosis not present

## 2017-01-08 DIAGNOSIS — I5030 Unspecified diastolic (congestive) heart failure: Secondary | ICD-10-CM | POA: Diagnosis not present

## 2017-02-11 DIAGNOSIS — I5030 Unspecified diastolic (congestive) heart failure: Secondary | ICD-10-CM | POA: Diagnosis not present

## 2017-02-11 DIAGNOSIS — I4891 Unspecified atrial fibrillation: Secondary | ICD-10-CM | POA: Diagnosis not present

## 2017-02-11 DIAGNOSIS — I10 Essential (primary) hypertension: Secondary | ICD-10-CM | POA: Diagnosis not present

## 2017-02-14 DIAGNOSIS — K219 Gastro-esophageal reflux disease without esophagitis: Secondary | ICD-10-CM | POA: Diagnosis not present

## 2017-02-14 DIAGNOSIS — R2681 Unsteadiness on feet: Secondary | ICD-10-CM | POA: Diagnosis not present

## 2017-02-14 DIAGNOSIS — E039 Hypothyroidism, unspecified: Secondary | ICD-10-CM | POA: Diagnosis not present

## 2017-02-14 DIAGNOSIS — R488 Other symbolic dysfunctions: Secondary | ICD-10-CM | POA: Diagnosis not present

## 2017-02-14 DIAGNOSIS — I4891 Unspecified atrial fibrillation: Secondary | ICD-10-CM | POA: Diagnosis not present

## 2017-02-14 DIAGNOSIS — R2689 Other abnormalities of gait and mobility: Secondary | ICD-10-CM | POA: Diagnosis not present

## 2017-02-14 DIAGNOSIS — R262 Difficulty in walking, not elsewhere classified: Secondary | ICD-10-CM | POA: Diagnosis not present

## 2017-02-14 DIAGNOSIS — I10 Essential (primary) hypertension: Secondary | ICD-10-CM | POA: Diagnosis not present

## 2017-02-14 DIAGNOSIS — M25569 Pain in unspecified knee: Secondary | ICD-10-CM | POA: Diagnosis not present

## 2017-02-14 DIAGNOSIS — I5030 Unspecified diastolic (congestive) heart failure: Secondary | ICD-10-CM | POA: Diagnosis not present

## 2017-02-14 DIAGNOSIS — M6281 Muscle weakness (generalized): Secondary | ICD-10-CM | POA: Diagnosis not present

## 2017-02-14 DIAGNOSIS — M25562 Pain in left knee: Secondary | ICD-10-CM | POA: Diagnosis not present

## 2017-02-14 DIAGNOSIS — E46 Unspecified protein-calorie malnutrition: Secondary | ICD-10-CM | POA: Diagnosis not present

## 2017-02-14 DIAGNOSIS — M25561 Pain in right knee: Secondary | ICD-10-CM | POA: Diagnosis not present

## 2017-02-15 DIAGNOSIS — M25562 Pain in left knee: Secondary | ICD-10-CM | POA: Diagnosis not present

## 2017-02-15 DIAGNOSIS — R2689 Other abnormalities of gait and mobility: Secondary | ICD-10-CM | POA: Diagnosis not present

## 2017-02-15 DIAGNOSIS — I5030 Unspecified diastolic (congestive) heart failure: Secondary | ICD-10-CM | POA: Diagnosis not present

## 2017-02-15 DIAGNOSIS — R488 Other symbolic dysfunctions: Secondary | ICD-10-CM | POA: Diagnosis not present

## 2017-02-15 DIAGNOSIS — M25561 Pain in right knee: Secondary | ICD-10-CM | POA: Diagnosis not present

## 2017-02-15 DIAGNOSIS — M25569 Pain in unspecified knee: Secondary | ICD-10-CM | POA: Diagnosis not present

## 2017-02-15 DIAGNOSIS — R262 Difficulty in walking, not elsewhere classified: Secondary | ICD-10-CM | POA: Diagnosis not present

## 2017-02-15 DIAGNOSIS — I10 Essential (primary) hypertension: Secondary | ICD-10-CM | POA: Diagnosis not present

## 2017-02-15 DIAGNOSIS — E039 Hypothyroidism, unspecified: Secondary | ICD-10-CM | POA: Diagnosis not present

## 2017-02-15 DIAGNOSIS — E46 Unspecified protein-calorie malnutrition: Secondary | ICD-10-CM | POA: Diagnosis not present

## 2017-02-15 DIAGNOSIS — M6281 Muscle weakness (generalized): Secondary | ICD-10-CM | POA: Diagnosis not present

## 2017-02-15 DIAGNOSIS — K219 Gastro-esophageal reflux disease without esophagitis: Secondary | ICD-10-CM | POA: Diagnosis not present

## 2017-02-15 DIAGNOSIS — I4891 Unspecified atrial fibrillation: Secondary | ICD-10-CM | POA: Diagnosis not present

## 2017-02-15 DIAGNOSIS — R2681 Unsteadiness on feet: Secondary | ICD-10-CM | POA: Diagnosis not present

## 2017-02-16 DIAGNOSIS — K219 Gastro-esophageal reflux disease without esophagitis: Secondary | ICD-10-CM | POA: Diagnosis not present

## 2017-02-16 DIAGNOSIS — R488 Other symbolic dysfunctions: Secondary | ICD-10-CM | POA: Diagnosis not present

## 2017-02-16 DIAGNOSIS — M25569 Pain in unspecified knee: Secondary | ICD-10-CM | POA: Diagnosis not present

## 2017-02-16 DIAGNOSIS — E46 Unspecified protein-calorie malnutrition: Secondary | ICD-10-CM | POA: Diagnosis not present

## 2017-02-16 DIAGNOSIS — M25562 Pain in left knee: Secondary | ICD-10-CM | POA: Diagnosis not present

## 2017-02-16 DIAGNOSIS — I5030 Unspecified diastolic (congestive) heart failure: Secondary | ICD-10-CM | POA: Diagnosis not present

## 2017-02-16 DIAGNOSIS — R262 Difficulty in walking, not elsewhere classified: Secondary | ICD-10-CM | POA: Diagnosis not present

## 2017-02-16 DIAGNOSIS — E039 Hypothyroidism, unspecified: Secondary | ICD-10-CM | POA: Diagnosis not present

## 2017-02-16 DIAGNOSIS — I10 Essential (primary) hypertension: Secondary | ICD-10-CM | POA: Diagnosis not present

## 2017-02-16 DIAGNOSIS — R2689 Other abnormalities of gait and mobility: Secondary | ICD-10-CM | POA: Diagnosis not present

## 2017-02-16 DIAGNOSIS — M25561 Pain in right knee: Secondary | ICD-10-CM | POA: Diagnosis not present

## 2017-02-16 DIAGNOSIS — M6281 Muscle weakness (generalized): Secondary | ICD-10-CM | POA: Diagnosis not present

## 2017-02-16 DIAGNOSIS — R2681 Unsteadiness on feet: Secondary | ICD-10-CM | POA: Diagnosis not present

## 2017-02-16 DIAGNOSIS — I4891 Unspecified atrial fibrillation: Secondary | ICD-10-CM | POA: Diagnosis not present

## 2017-02-17 DIAGNOSIS — R262 Difficulty in walking, not elsewhere classified: Secondary | ICD-10-CM | POA: Diagnosis not present

## 2017-02-17 DIAGNOSIS — R2681 Unsteadiness on feet: Secondary | ICD-10-CM | POA: Diagnosis not present

## 2017-02-17 DIAGNOSIS — I5030 Unspecified diastolic (congestive) heart failure: Secondary | ICD-10-CM | POA: Diagnosis not present

## 2017-02-17 DIAGNOSIS — K219 Gastro-esophageal reflux disease without esophagitis: Secondary | ICD-10-CM | POA: Diagnosis not present

## 2017-02-17 DIAGNOSIS — I4891 Unspecified atrial fibrillation: Secondary | ICD-10-CM | POA: Diagnosis not present

## 2017-02-17 DIAGNOSIS — R488 Other symbolic dysfunctions: Secondary | ICD-10-CM | POA: Diagnosis not present

## 2017-02-17 DIAGNOSIS — M25561 Pain in right knee: Secondary | ICD-10-CM | POA: Diagnosis not present

## 2017-02-17 DIAGNOSIS — E039 Hypothyroidism, unspecified: Secondary | ICD-10-CM | POA: Diagnosis not present

## 2017-02-17 DIAGNOSIS — E46 Unspecified protein-calorie malnutrition: Secondary | ICD-10-CM | POA: Diagnosis not present

## 2017-02-17 DIAGNOSIS — I10 Essential (primary) hypertension: Secondary | ICD-10-CM | POA: Diagnosis not present

## 2017-02-17 DIAGNOSIS — M6281 Muscle weakness (generalized): Secondary | ICD-10-CM | POA: Diagnosis not present

## 2017-02-17 DIAGNOSIS — M25569 Pain in unspecified knee: Secondary | ICD-10-CM | POA: Diagnosis not present

## 2017-02-17 DIAGNOSIS — R2689 Other abnormalities of gait and mobility: Secondary | ICD-10-CM | POA: Diagnosis not present

## 2017-02-17 DIAGNOSIS — M25562 Pain in left knee: Secondary | ICD-10-CM | POA: Diagnosis not present

## 2017-02-20 DIAGNOSIS — M25562 Pain in left knee: Secondary | ICD-10-CM | POA: Diagnosis not present

## 2017-02-20 DIAGNOSIS — M25569 Pain in unspecified knee: Secondary | ICD-10-CM | POA: Diagnosis not present

## 2017-02-20 DIAGNOSIS — R488 Other symbolic dysfunctions: Secondary | ICD-10-CM | POA: Diagnosis not present

## 2017-02-20 DIAGNOSIS — R262 Difficulty in walking, not elsewhere classified: Secondary | ICD-10-CM | POA: Diagnosis not present

## 2017-02-20 DIAGNOSIS — E039 Hypothyroidism, unspecified: Secondary | ICD-10-CM | POA: Diagnosis not present

## 2017-02-20 DIAGNOSIS — R2681 Unsteadiness on feet: Secondary | ICD-10-CM | POA: Diagnosis not present

## 2017-02-20 DIAGNOSIS — M6281 Muscle weakness (generalized): Secondary | ICD-10-CM | POA: Diagnosis not present

## 2017-02-20 DIAGNOSIS — K219 Gastro-esophageal reflux disease without esophagitis: Secondary | ICD-10-CM | POA: Diagnosis not present

## 2017-02-20 DIAGNOSIS — I4891 Unspecified atrial fibrillation: Secondary | ICD-10-CM | POA: Diagnosis not present

## 2017-02-20 DIAGNOSIS — I5030 Unspecified diastolic (congestive) heart failure: Secondary | ICD-10-CM | POA: Diagnosis not present

## 2017-02-20 DIAGNOSIS — M25561 Pain in right knee: Secondary | ICD-10-CM | POA: Diagnosis not present

## 2017-02-20 DIAGNOSIS — E46 Unspecified protein-calorie malnutrition: Secondary | ICD-10-CM | POA: Diagnosis not present

## 2017-02-20 DIAGNOSIS — R2689 Other abnormalities of gait and mobility: Secondary | ICD-10-CM | POA: Diagnosis not present

## 2017-02-20 DIAGNOSIS — I10 Essential (primary) hypertension: Secondary | ICD-10-CM | POA: Diagnosis not present

## 2017-02-21 DIAGNOSIS — I10 Essential (primary) hypertension: Secondary | ICD-10-CM | POA: Diagnosis not present

## 2017-02-21 DIAGNOSIS — E039 Hypothyroidism, unspecified: Secondary | ICD-10-CM | POA: Diagnosis not present

## 2017-02-21 DIAGNOSIS — K219 Gastro-esophageal reflux disease without esophagitis: Secondary | ICD-10-CM | POA: Diagnosis not present

## 2017-02-21 DIAGNOSIS — R2689 Other abnormalities of gait and mobility: Secondary | ICD-10-CM | POA: Diagnosis not present

## 2017-02-21 DIAGNOSIS — E46 Unspecified protein-calorie malnutrition: Secondary | ICD-10-CM | POA: Diagnosis not present

## 2017-02-21 DIAGNOSIS — M25562 Pain in left knee: Secondary | ICD-10-CM | POA: Diagnosis not present

## 2017-02-21 DIAGNOSIS — I4891 Unspecified atrial fibrillation: Secondary | ICD-10-CM | POA: Diagnosis not present

## 2017-02-21 DIAGNOSIS — R2681 Unsteadiness on feet: Secondary | ICD-10-CM | POA: Diagnosis not present

## 2017-02-21 DIAGNOSIS — I5030 Unspecified diastolic (congestive) heart failure: Secondary | ICD-10-CM | POA: Diagnosis not present

## 2017-02-21 DIAGNOSIS — R488 Other symbolic dysfunctions: Secondary | ICD-10-CM | POA: Diagnosis not present

## 2017-02-21 DIAGNOSIS — R262 Difficulty in walking, not elsewhere classified: Secondary | ICD-10-CM | POA: Diagnosis not present

## 2017-02-21 DIAGNOSIS — M6281 Muscle weakness (generalized): Secondary | ICD-10-CM | POA: Diagnosis not present

## 2017-02-21 DIAGNOSIS — M25569 Pain in unspecified knee: Secondary | ICD-10-CM | POA: Diagnosis not present

## 2017-02-21 DIAGNOSIS — M25561 Pain in right knee: Secondary | ICD-10-CM | POA: Diagnosis not present

## 2017-02-22 DIAGNOSIS — K219 Gastro-esophageal reflux disease without esophagitis: Secondary | ICD-10-CM | POA: Diagnosis not present

## 2017-02-22 DIAGNOSIS — E46 Unspecified protein-calorie malnutrition: Secondary | ICD-10-CM | POA: Diagnosis not present

## 2017-02-22 DIAGNOSIS — R262 Difficulty in walking, not elsewhere classified: Secondary | ICD-10-CM | POA: Diagnosis not present

## 2017-02-22 DIAGNOSIS — M25562 Pain in left knee: Secondary | ICD-10-CM | POA: Diagnosis not present

## 2017-02-22 DIAGNOSIS — M6281 Muscle weakness (generalized): Secondary | ICD-10-CM | POA: Diagnosis not present

## 2017-02-22 DIAGNOSIS — I4891 Unspecified atrial fibrillation: Secondary | ICD-10-CM | POA: Diagnosis not present

## 2017-02-22 DIAGNOSIS — I5030 Unspecified diastolic (congestive) heart failure: Secondary | ICD-10-CM | POA: Diagnosis not present

## 2017-02-22 DIAGNOSIS — M25561 Pain in right knee: Secondary | ICD-10-CM | POA: Diagnosis not present

## 2017-02-22 DIAGNOSIS — E039 Hypothyroidism, unspecified: Secondary | ICD-10-CM | POA: Diagnosis not present

## 2017-02-22 DIAGNOSIS — R2689 Other abnormalities of gait and mobility: Secondary | ICD-10-CM | POA: Diagnosis not present

## 2017-02-22 DIAGNOSIS — I10 Essential (primary) hypertension: Secondary | ICD-10-CM | POA: Diagnosis not present

## 2017-02-22 DIAGNOSIS — R2681 Unsteadiness on feet: Secondary | ICD-10-CM | POA: Diagnosis not present

## 2017-02-22 DIAGNOSIS — M25569 Pain in unspecified knee: Secondary | ICD-10-CM | POA: Diagnosis not present

## 2017-02-22 DIAGNOSIS — R488 Other symbolic dysfunctions: Secondary | ICD-10-CM | POA: Diagnosis not present

## 2017-02-23 DIAGNOSIS — R2681 Unsteadiness on feet: Secondary | ICD-10-CM | POA: Diagnosis not present

## 2017-02-23 DIAGNOSIS — M25561 Pain in right knee: Secondary | ICD-10-CM | POA: Diagnosis not present

## 2017-02-23 DIAGNOSIS — R488 Other symbolic dysfunctions: Secondary | ICD-10-CM | POA: Diagnosis not present

## 2017-02-23 DIAGNOSIS — R262 Difficulty in walking, not elsewhere classified: Secondary | ICD-10-CM | POA: Diagnosis not present

## 2017-02-23 DIAGNOSIS — E039 Hypothyroidism, unspecified: Secondary | ICD-10-CM | POA: Diagnosis not present

## 2017-02-23 DIAGNOSIS — I10 Essential (primary) hypertension: Secondary | ICD-10-CM | POA: Diagnosis not present

## 2017-02-23 DIAGNOSIS — M25569 Pain in unspecified knee: Secondary | ICD-10-CM | POA: Diagnosis not present

## 2017-02-23 DIAGNOSIS — E46 Unspecified protein-calorie malnutrition: Secondary | ICD-10-CM | POA: Diagnosis not present

## 2017-02-23 DIAGNOSIS — I4891 Unspecified atrial fibrillation: Secondary | ICD-10-CM | POA: Diagnosis not present

## 2017-02-23 DIAGNOSIS — R2689 Other abnormalities of gait and mobility: Secondary | ICD-10-CM | POA: Diagnosis not present

## 2017-02-23 DIAGNOSIS — M25562 Pain in left knee: Secondary | ICD-10-CM | POA: Diagnosis not present

## 2017-02-23 DIAGNOSIS — M6281 Muscle weakness (generalized): Secondary | ICD-10-CM | POA: Diagnosis not present

## 2017-02-23 DIAGNOSIS — I5030 Unspecified diastolic (congestive) heart failure: Secondary | ICD-10-CM | POA: Diagnosis not present

## 2017-02-23 DIAGNOSIS — K219 Gastro-esophageal reflux disease without esophagitis: Secondary | ICD-10-CM | POA: Diagnosis not present

## 2017-02-24 DIAGNOSIS — M25569 Pain in unspecified knee: Secondary | ICD-10-CM | POA: Diagnosis not present

## 2017-02-24 DIAGNOSIS — I5030 Unspecified diastolic (congestive) heart failure: Secondary | ICD-10-CM | POA: Diagnosis not present

## 2017-02-24 DIAGNOSIS — R2681 Unsteadiness on feet: Secondary | ICD-10-CM | POA: Diagnosis not present

## 2017-02-24 DIAGNOSIS — M25562 Pain in left knee: Secondary | ICD-10-CM | POA: Diagnosis not present

## 2017-02-24 DIAGNOSIS — M6281 Muscle weakness (generalized): Secondary | ICD-10-CM | POA: Diagnosis not present

## 2017-02-24 DIAGNOSIS — R2689 Other abnormalities of gait and mobility: Secondary | ICD-10-CM | POA: Diagnosis not present

## 2017-02-24 DIAGNOSIS — K219 Gastro-esophageal reflux disease without esophagitis: Secondary | ICD-10-CM | POA: Diagnosis not present

## 2017-02-24 DIAGNOSIS — I10 Essential (primary) hypertension: Secondary | ICD-10-CM | POA: Diagnosis not present

## 2017-02-24 DIAGNOSIS — M25561 Pain in right knee: Secondary | ICD-10-CM | POA: Diagnosis not present

## 2017-02-24 DIAGNOSIS — R488 Other symbolic dysfunctions: Secondary | ICD-10-CM | POA: Diagnosis not present

## 2017-02-24 DIAGNOSIS — E039 Hypothyroidism, unspecified: Secondary | ICD-10-CM | POA: Diagnosis not present

## 2017-02-24 DIAGNOSIS — I4891 Unspecified atrial fibrillation: Secondary | ICD-10-CM | POA: Diagnosis not present

## 2017-02-24 DIAGNOSIS — E46 Unspecified protein-calorie malnutrition: Secondary | ICD-10-CM | POA: Diagnosis not present

## 2017-02-24 DIAGNOSIS — R262 Difficulty in walking, not elsewhere classified: Secondary | ICD-10-CM | POA: Diagnosis not present

## 2017-02-25 DIAGNOSIS — R2689 Other abnormalities of gait and mobility: Secondary | ICD-10-CM | POA: Diagnosis not present

## 2017-02-25 DIAGNOSIS — K219 Gastro-esophageal reflux disease without esophagitis: Secondary | ICD-10-CM | POA: Diagnosis not present

## 2017-02-25 DIAGNOSIS — E46 Unspecified protein-calorie malnutrition: Secondary | ICD-10-CM | POA: Diagnosis not present

## 2017-02-25 DIAGNOSIS — I5030 Unspecified diastolic (congestive) heart failure: Secondary | ICD-10-CM | POA: Diagnosis not present

## 2017-02-25 DIAGNOSIS — M25562 Pain in left knee: Secondary | ICD-10-CM | POA: Diagnosis not present

## 2017-02-25 DIAGNOSIS — E039 Hypothyroidism, unspecified: Secondary | ICD-10-CM | POA: Diagnosis not present

## 2017-02-25 DIAGNOSIS — I10 Essential (primary) hypertension: Secondary | ICD-10-CM | POA: Diagnosis not present

## 2017-02-25 DIAGNOSIS — M25569 Pain in unspecified knee: Secondary | ICD-10-CM | POA: Diagnosis not present

## 2017-02-25 DIAGNOSIS — R488 Other symbolic dysfunctions: Secondary | ICD-10-CM | POA: Diagnosis not present

## 2017-02-25 DIAGNOSIS — R2681 Unsteadiness on feet: Secondary | ICD-10-CM | POA: Diagnosis not present

## 2017-02-25 DIAGNOSIS — R262 Difficulty in walking, not elsewhere classified: Secondary | ICD-10-CM | POA: Diagnosis not present

## 2017-02-25 DIAGNOSIS — I4891 Unspecified atrial fibrillation: Secondary | ICD-10-CM | POA: Diagnosis not present

## 2017-02-25 DIAGNOSIS — M25561 Pain in right knee: Secondary | ICD-10-CM | POA: Diagnosis not present

## 2017-02-25 DIAGNOSIS — M6281 Muscle weakness (generalized): Secondary | ICD-10-CM | POA: Diagnosis not present

## 2017-02-27 DIAGNOSIS — E46 Unspecified protein-calorie malnutrition: Secondary | ICD-10-CM | POA: Diagnosis not present

## 2017-02-27 DIAGNOSIS — R2689 Other abnormalities of gait and mobility: Secondary | ICD-10-CM | POA: Diagnosis not present

## 2017-02-27 DIAGNOSIS — I5030 Unspecified diastolic (congestive) heart failure: Secondary | ICD-10-CM | POA: Diagnosis not present

## 2017-02-27 DIAGNOSIS — I10 Essential (primary) hypertension: Secondary | ICD-10-CM | POA: Diagnosis not present

## 2017-02-27 DIAGNOSIS — I4891 Unspecified atrial fibrillation: Secondary | ICD-10-CM | POA: Diagnosis not present

## 2017-02-27 DIAGNOSIS — R262 Difficulty in walking, not elsewhere classified: Secondary | ICD-10-CM | POA: Diagnosis not present

## 2017-02-27 DIAGNOSIS — M25562 Pain in left knee: Secondary | ICD-10-CM | POA: Diagnosis not present

## 2017-02-27 DIAGNOSIS — M25569 Pain in unspecified knee: Secondary | ICD-10-CM | POA: Diagnosis not present

## 2017-02-27 DIAGNOSIS — R488 Other symbolic dysfunctions: Secondary | ICD-10-CM | POA: Diagnosis not present

## 2017-02-27 DIAGNOSIS — M6281 Muscle weakness (generalized): Secondary | ICD-10-CM | POA: Diagnosis not present

## 2017-02-27 DIAGNOSIS — R2681 Unsteadiness on feet: Secondary | ICD-10-CM | POA: Diagnosis not present

## 2017-02-27 DIAGNOSIS — E039 Hypothyroidism, unspecified: Secondary | ICD-10-CM | POA: Diagnosis not present

## 2017-02-27 DIAGNOSIS — K219 Gastro-esophageal reflux disease without esophagitis: Secondary | ICD-10-CM | POA: Diagnosis not present

## 2017-02-27 DIAGNOSIS — M25561 Pain in right knee: Secondary | ICD-10-CM | POA: Diagnosis not present

## 2017-02-28 DIAGNOSIS — I5030 Unspecified diastolic (congestive) heart failure: Secondary | ICD-10-CM | POA: Diagnosis not present

## 2017-02-28 DIAGNOSIS — I4891 Unspecified atrial fibrillation: Secondary | ICD-10-CM | POA: Diagnosis not present

## 2017-02-28 DIAGNOSIS — M25562 Pain in left knee: Secondary | ICD-10-CM | POA: Diagnosis not present

## 2017-02-28 DIAGNOSIS — E46 Unspecified protein-calorie malnutrition: Secondary | ICD-10-CM | POA: Diagnosis not present

## 2017-02-28 DIAGNOSIS — I10 Essential (primary) hypertension: Secondary | ICD-10-CM | POA: Diagnosis not present

## 2017-02-28 DIAGNOSIS — R488 Other symbolic dysfunctions: Secondary | ICD-10-CM | POA: Diagnosis not present

## 2017-02-28 DIAGNOSIS — M25561 Pain in right knee: Secondary | ICD-10-CM | POA: Diagnosis not present

## 2017-02-28 DIAGNOSIS — K219 Gastro-esophageal reflux disease without esophagitis: Secondary | ICD-10-CM | POA: Diagnosis not present

## 2017-02-28 DIAGNOSIS — R262 Difficulty in walking, not elsewhere classified: Secondary | ICD-10-CM | POA: Diagnosis not present

## 2017-02-28 DIAGNOSIS — R2689 Other abnormalities of gait and mobility: Secondary | ICD-10-CM | POA: Diagnosis not present

## 2017-02-28 DIAGNOSIS — M25569 Pain in unspecified knee: Secondary | ICD-10-CM | POA: Diagnosis not present

## 2017-02-28 DIAGNOSIS — E039 Hypothyroidism, unspecified: Secondary | ICD-10-CM | POA: Diagnosis not present

## 2017-02-28 DIAGNOSIS — M6281 Muscle weakness (generalized): Secondary | ICD-10-CM | POA: Diagnosis not present

## 2017-02-28 DIAGNOSIS — R2681 Unsteadiness on feet: Secondary | ICD-10-CM | POA: Diagnosis not present

## 2017-03-01 DIAGNOSIS — M6281 Muscle weakness (generalized): Secondary | ICD-10-CM | POA: Diagnosis not present

## 2017-03-01 DIAGNOSIS — R262 Difficulty in walking, not elsewhere classified: Secondary | ICD-10-CM | POA: Diagnosis not present

## 2017-03-01 DIAGNOSIS — E039 Hypothyroidism, unspecified: Secondary | ICD-10-CM | POA: Diagnosis not present

## 2017-03-01 DIAGNOSIS — M25562 Pain in left knee: Secondary | ICD-10-CM | POA: Diagnosis not present

## 2017-03-01 DIAGNOSIS — R488 Other symbolic dysfunctions: Secondary | ICD-10-CM | POA: Diagnosis not present

## 2017-03-01 DIAGNOSIS — R2689 Other abnormalities of gait and mobility: Secondary | ICD-10-CM | POA: Diagnosis not present

## 2017-03-01 DIAGNOSIS — E46 Unspecified protein-calorie malnutrition: Secondary | ICD-10-CM | POA: Diagnosis not present

## 2017-03-01 DIAGNOSIS — M25561 Pain in right knee: Secondary | ICD-10-CM | POA: Diagnosis not present

## 2017-03-01 DIAGNOSIS — I4891 Unspecified atrial fibrillation: Secondary | ICD-10-CM | POA: Diagnosis not present

## 2017-03-01 DIAGNOSIS — I5030 Unspecified diastolic (congestive) heart failure: Secondary | ICD-10-CM | POA: Diagnosis not present

## 2017-03-01 DIAGNOSIS — R2681 Unsteadiness on feet: Secondary | ICD-10-CM | POA: Diagnosis not present

## 2017-03-01 DIAGNOSIS — M25569 Pain in unspecified knee: Secondary | ICD-10-CM | POA: Diagnosis not present

## 2017-03-01 DIAGNOSIS — K219 Gastro-esophageal reflux disease without esophagitis: Secondary | ICD-10-CM | POA: Diagnosis not present

## 2017-03-01 DIAGNOSIS — I10 Essential (primary) hypertension: Secondary | ICD-10-CM | POA: Diagnosis not present

## 2017-03-02 DIAGNOSIS — I5030 Unspecified diastolic (congestive) heart failure: Secondary | ICD-10-CM | POA: Diagnosis not present

## 2017-03-02 DIAGNOSIS — M25569 Pain in unspecified knee: Secondary | ICD-10-CM | POA: Diagnosis not present

## 2017-03-02 DIAGNOSIS — I10 Essential (primary) hypertension: Secondary | ICD-10-CM | POA: Diagnosis not present

## 2017-03-02 DIAGNOSIS — R488 Other symbolic dysfunctions: Secondary | ICD-10-CM | POA: Diagnosis not present

## 2017-03-02 DIAGNOSIS — R2681 Unsteadiness on feet: Secondary | ICD-10-CM | POA: Diagnosis not present

## 2017-03-02 DIAGNOSIS — R262 Difficulty in walking, not elsewhere classified: Secondary | ICD-10-CM | POA: Diagnosis not present

## 2017-03-02 DIAGNOSIS — M25562 Pain in left knee: Secondary | ICD-10-CM | POA: Diagnosis not present

## 2017-03-02 DIAGNOSIS — K219 Gastro-esophageal reflux disease without esophagitis: Secondary | ICD-10-CM | POA: Diagnosis not present

## 2017-03-02 DIAGNOSIS — E46 Unspecified protein-calorie malnutrition: Secondary | ICD-10-CM | POA: Diagnosis not present

## 2017-03-02 DIAGNOSIS — R2689 Other abnormalities of gait and mobility: Secondary | ICD-10-CM | POA: Diagnosis not present

## 2017-03-02 DIAGNOSIS — M6281 Muscle weakness (generalized): Secondary | ICD-10-CM | POA: Diagnosis not present

## 2017-03-02 DIAGNOSIS — I4891 Unspecified atrial fibrillation: Secondary | ICD-10-CM | POA: Diagnosis not present

## 2017-03-02 DIAGNOSIS — M25561 Pain in right knee: Secondary | ICD-10-CM | POA: Diagnosis not present

## 2017-03-02 DIAGNOSIS — E039 Hypothyroidism, unspecified: Secondary | ICD-10-CM | POA: Diagnosis not present

## 2017-03-03 DIAGNOSIS — R488 Other symbolic dysfunctions: Secondary | ICD-10-CM | POA: Diagnosis not present

## 2017-03-03 DIAGNOSIS — I4891 Unspecified atrial fibrillation: Secondary | ICD-10-CM | POA: Diagnosis not present

## 2017-03-03 DIAGNOSIS — I10 Essential (primary) hypertension: Secondary | ICD-10-CM | POA: Diagnosis not present

## 2017-03-03 DIAGNOSIS — R2681 Unsteadiness on feet: Secondary | ICD-10-CM | POA: Diagnosis not present

## 2017-03-03 DIAGNOSIS — M6281 Muscle weakness (generalized): Secondary | ICD-10-CM | POA: Diagnosis not present

## 2017-03-03 DIAGNOSIS — E039 Hypothyroidism, unspecified: Secondary | ICD-10-CM | POA: Diagnosis not present

## 2017-03-03 DIAGNOSIS — I5030 Unspecified diastolic (congestive) heart failure: Secondary | ICD-10-CM | POA: Diagnosis not present

## 2017-03-03 DIAGNOSIS — R262 Difficulty in walking, not elsewhere classified: Secondary | ICD-10-CM | POA: Diagnosis not present

## 2017-03-03 DIAGNOSIS — M25561 Pain in right knee: Secondary | ICD-10-CM | POA: Diagnosis not present

## 2017-03-03 DIAGNOSIS — R2689 Other abnormalities of gait and mobility: Secondary | ICD-10-CM | POA: Diagnosis not present

## 2017-03-03 DIAGNOSIS — K219 Gastro-esophageal reflux disease without esophagitis: Secondary | ICD-10-CM | POA: Diagnosis not present

## 2017-03-03 DIAGNOSIS — M25569 Pain in unspecified knee: Secondary | ICD-10-CM | POA: Diagnosis not present

## 2017-03-03 DIAGNOSIS — M25562 Pain in left knee: Secondary | ICD-10-CM | POA: Diagnosis not present

## 2017-03-03 DIAGNOSIS — E46 Unspecified protein-calorie malnutrition: Secondary | ICD-10-CM | POA: Diagnosis not present

## 2017-03-06 DIAGNOSIS — I4891 Unspecified atrial fibrillation: Secondary | ICD-10-CM | POA: Diagnosis not present

## 2017-03-06 DIAGNOSIS — E46 Unspecified protein-calorie malnutrition: Secondary | ICD-10-CM | POA: Diagnosis not present

## 2017-03-06 DIAGNOSIS — R2681 Unsteadiness on feet: Secondary | ICD-10-CM | POA: Diagnosis not present

## 2017-03-06 DIAGNOSIS — M25561 Pain in right knee: Secondary | ICD-10-CM | POA: Diagnosis not present

## 2017-03-06 DIAGNOSIS — K219 Gastro-esophageal reflux disease without esophagitis: Secondary | ICD-10-CM | POA: Diagnosis not present

## 2017-03-06 DIAGNOSIS — M6281 Muscle weakness (generalized): Secondary | ICD-10-CM | POA: Diagnosis not present

## 2017-03-06 DIAGNOSIS — I10 Essential (primary) hypertension: Secondary | ICD-10-CM | POA: Diagnosis not present

## 2017-03-06 DIAGNOSIS — R2689 Other abnormalities of gait and mobility: Secondary | ICD-10-CM | POA: Diagnosis not present

## 2017-03-06 DIAGNOSIS — I5030 Unspecified diastolic (congestive) heart failure: Secondary | ICD-10-CM | POA: Diagnosis not present

## 2017-03-06 DIAGNOSIS — M25569 Pain in unspecified knee: Secondary | ICD-10-CM | POA: Diagnosis not present

## 2017-03-06 DIAGNOSIS — M25562 Pain in left knee: Secondary | ICD-10-CM | POA: Diagnosis not present

## 2017-03-06 DIAGNOSIS — R488 Other symbolic dysfunctions: Secondary | ICD-10-CM | POA: Diagnosis not present

## 2017-03-06 DIAGNOSIS — R262 Difficulty in walking, not elsewhere classified: Secondary | ICD-10-CM | POA: Diagnosis not present

## 2017-03-06 DIAGNOSIS — E039 Hypothyroidism, unspecified: Secondary | ICD-10-CM | POA: Diagnosis not present

## 2017-03-07 DIAGNOSIS — R2681 Unsteadiness on feet: Secondary | ICD-10-CM | POA: Diagnosis not present

## 2017-03-07 DIAGNOSIS — I4891 Unspecified atrial fibrillation: Secondary | ICD-10-CM | POA: Diagnosis not present

## 2017-03-07 DIAGNOSIS — R2689 Other abnormalities of gait and mobility: Secondary | ICD-10-CM | POA: Diagnosis not present

## 2017-03-07 DIAGNOSIS — M6281 Muscle weakness (generalized): Secondary | ICD-10-CM | POA: Diagnosis not present

## 2017-03-07 DIAGNOSIS — R262 Difficulty in walking, not elsewhere classified: Secondary | ICD-10-CM | POA: Diagnosis not present

## 2017-03-07 DIAGNOSIS — I5030 Unspecified diastolic (congestive) heart failure: Secondary | ICD-10-CM | POA: Diagnosis not present

## 2017-03-07 DIAGNOSIS — M25561 Pain in right knee: Secondary | ICD-10-CM | POA: Diagnosis not present

## 2017-03-07 DIAGNOSIS — E46 Unspecified protein-calorie malnutrition: Secondary | ICD-10-CM | POA: Diagnosis not present

## 2017-03-07 DIAGNOSIS — M25562 Pain in left knee: Secondary | ICD-10-CM | POA: Diagnosis not present

## 2017-03-07 DIAGNOSIS — E039 Hypothyroidism, unspecified: Secondary | ICD-10-CM | POA: Diagnosis not present

## 2017-03-07 DIAGNOSIS — R488 Other symbolic dysfunctions: Secondary | ICD-10-CM | POA: Diagnosis not present

## 2017-03-07 DIAGNOSIS — I10 Essential (primary) hypertension: Secondary | ICD-10-CM | POA: Diagnosis not present

## 2017-03-07 DIAGNOSIS — K219 Gastro-esophageal reflux disease without esophagitis: Secondary | ICD-10-CM | POA: Diagnosis not present

## 2017-03-07 DIAGNOSIS — M25569 Pain in unspecified knee: Secondary | ICD-10-CM | POA: Diagnosis not present

## 2017-03-08 DIAGNOSIS — K219 Gastro-esophageal reflux disease without esophagitis: Secondary | ICD-10-CM | POA: Diagnosis not present

## 2017-03-08 DIAGNOSIS — R488 Other symbolic dysfunctions: Secondary | ICD-10-CM | POA: Diagnosis not present

## 2017-03-08 DIAGNOSIS — I5030 Unspecified diastolic (congestive) heart failure: Secondary | ICD-10-CM | POA: Diagnosis not present

## 2017-03-08 DIAGNOSIS — M25569 Pain in unspecified knee: Secondary | ICD-10-CM | POA: Diagnosis not present

## 2017-03-08 DIAGNOSIS — I4891 Unspecified atrial fibrillation: Secondary | ICD-10-CM | POA: Diagnosis not present

## 2017-03-08 DIAGNOSIS — R2689 Other abnormalities of gait and mobility: Secondary | ICD-10-CM | POA: Diagnosis not present

## 2017-03-08 DIAGNOSIS — M25562 Pain in left knee: Secondary | ICD-10-CM | POA: Diagnosis not present

## 2017-03-08 DIAGNOSIS — I10 Essential (primary) hypertension: Secondary | ICD-10-CM | POA: Diagnosis not present

## 2017-03-08 DIAGNOSIS — R2681 Unsteadiness on feet: Secondary | ICD-10-CM | POA: Diagnosis not present

## 2017-03-08 DIAGNOSIS — R262 Difficulty in walking, not elsewhere classified: Secondary | ICD-10-CM | POA: Diagnosis not present

## 2017-03-08 DIAGNOSIS — M6281 Muscle weakness (generalized): Secondary | ICD-10-CM | POA: Diagnosis not present

## 2017-03-08 DIAGNOSIS — E46 Unspecified protein-calorie malnutrition: Secondary | ICD-10-CM | POA: Diagnosis not present

## 2017-03-08 DIAGNOSIS — M25561 Pain in right knee: Secondary | ICD-10-CM | POA: Diagnosis not present

## 2017-03-08 DIAGNOSIS — E039 Hypothyroidism, unspecified: Secondary | ICD-10-CM | POA: Diagnosis not present

## 2017-03-09 DIAGNOSIS — M25561 Pain in right knee: Secondary | ICD-10-CM | POA: Diagnosis not present

## 2017-03-09 DIAGNOSIS — I10 Essential (primary) hypertension: Secondary | ICD-10-CM | POA: Diagnosis not present

## 2017-03-09 DIAGNOSIS — R488 Other symbolic dysfunctions: Secondary | ICD-10-CM | POA: Diagnosis not present

## 2017-03-09 DIAGNOSIS — M25562 Pain in left knee: Secondary | ICD-10-CM | POA: Diagnosis not present

## 2017-03-09 DIAGNOSIS — I5030 Unspecified diastolic (congestive) heart failure: Secondary | ICD-10-CM | POA: Diagnosis not present

## 2017-03-09 DIAGNOSIS — E46 Unspecified protein-calorie malnutrition: Secondary | ICD-10-CM | POA: Diagnosis not present

## 2017-03-09 DIAGNOSIS — I4891 Unspecified atrial fibrillation: Secondary | ICD-10-CM | POA: Diagnosis not present

## 2017-03-09 DIAGNOSIS — M25569 Pain in unspecified knee: Secondary | ICD-10-CM | POA: Diagnosis not present

## 2017-03-09 DIAGNOSIS — R2689 Other abnormalities of gait and mobility: Secondary | ICD-10-CM | POA: Diagnosis not present

## 2017-03-09 DIAGNOSIS — M6281 Muscle weakness (generalized): Secondary | ICD-10-CM | POA: Diagnosis not present

## 2017-03-09 DIAGNOSIS — K219 Gastro-esophageal reflux disease without esophagitis: Secondary | ICD-10-CM | POA: Diagnosis not present

## 2017-03-09 DIAGNOSIS — R262 Difficulty in walking, not elsewhere classified: Secondary | ICD-10-CM | POA: Diagnosis not present

## 2017-03-09 DIAGNOSIS — E039 Hypothyroidism, unspecified: Secondary | ICD-10-CM | POA: Diagnosis not present

## 2017-03-09 DIAGNOSIS — R2681 Unsteadiness on feet: Secondary | ICD-10-CM | POA: Diagnosis not present

## 2017-03-10 DIAGNOSIS — M25562 Pain in left knee: Secondary | ICD-10-CM | POA: Diagnosis not present

## 2017-03-10 DIAGNOSIS — R262 Difficulty in walking, not elsewhere classified: Secondary | ICD-10-CM | POA: Diagnosis not present

## 2017-03-10 DIAGNOSIS — M25561 Pain in right knee: Secondary | ICD-10-CM | POA: Diagnosis not present

## 2017-03-10 DIAGNOSIS — M6281 Muscle weakness (generalized): Secondary | ICD-10-CM | POA: Diagnosis not present

## 2017-03-10 DIAGNOSIS — I10 Essential (primary) hypertension: Secondary | ICD-10-CM | POA: Diagnosis not present

## 2017-03-10 DIAGNOSIS — E46 Unspecified protein-calorie malnutrition: Secondary | ICD-10-CM | POA: Diagnosis not present

## 2017-03-10 DIAGNOSIS — K219 Gastro-esophageal reflux disease without esophagitis: Secondary | ICD-10-CM | POA: Diagnosis not present

## 2017-03-10 DIAGNOSIS — R2681 Unsteadiness on feet: Secondary | ICD-10-CM | POA: Diagnosis not present

## 2017-03-10 DIAGNOSIS — I5030 Unspecified diastolic (congestive) heart failure: Secondary | ICD-10-CM | POA: Diagnosis not present

## 2017-03-10 DIAGNOSIS — I4891 Unspecified atrial fibrillation: Secondary | ICD-10-CM | POA: Diagnosis not present

## 2017-03-10 DIAGNOSIS — R488 Other symbolic dysfunctions: Secondary | ICD-10-CM | POA: Diagnosis not present

## 2017-03-10 DIAGNOSIS — M25569 Pain in unspecified knee: Secondary | ICD-10-CM | POA: Diagnosis not present

## 2017-03-10 DIAGNOSIS — E039 Hypothyroidism, unspecified: Secondary | ICD-10-CM | POA: Diagnosis not present

## 2017-03-10 DIAGNOSIS — R2689 Other abnormalities of gait and mobility: Secondary | ICD-10-CM | POA: Diagnosis not present

## 2017-03-13 DIAGNOSIS — I5030 Unspecified diastolic (congestive) heart failure: Secondary | ICD-10-CM | POA: Diagnosis not present

## 2017-03-13 DIAGNOSIS — M25562 Pain in left knee: Secondary | ICD-10-CM | POA: Diagnosis not present

## 2017-03-13 DIAGNOSIS — I4891 Unspecified atrial fibrillation: Secondary | ICD-10-CM | POA: Diagnosis not present

## 2017-03-13 DIAGNOSIS — K219 Gastro-esophageal reflux disease without esophagitis: Secondary | ICD-10-CM | POA: Diagnosis not present

## 2017-03-13 DIAGNOSIS — R488 Other symbolic dysfunctions: Secondary | ICD-10-CM | POA: Diagnosis not present

## 2017-03-13 DIAGNOSIS — E46 Unspecified protein-calorie malnutrition: Secondary | ICD-10-CM | POA: Diagnosis not present

## 2017-03-13 DIAGNOSIS — E039 Hypothyroidism, unspecified: Secondary | ICD-10-CM | POA: Diagnosis not present

## 2017-03-13 DIAGNOSIS — M25561 Pain in right knee: Secondary | ICD-10-CM | POA: Diagnosis not present

## 2017-03-13 DIAGNOSIS — M6281 Muscle weakness (generalized): Secondary | ICD-10-CM | POA: Diagnosis not present

## 2017-03-13 DIAGNOSIS — I10 Essential (primary) hypertension: Secondary | ICD-10-CM | POA: Diagnosis not present

## 2017-03-13 DIAGNOSIS — R2681 Unsteadiness on feet: Secondary | ICD-10-CM | POA: Diagnosis not present

## 2017-03-13 DIAGNOSIS — R262 Difficulty in walking, not elsewhere classified: Secondary | ICD-10-CM | POA: Diagnosis not present

## 2017-03-13 DIAGNOSIS — M25569 Pain in unspecified knee: Secondary | ICD-10-CM | POA: Diagnosis not present

## 2017-03-13 DIAGNOSIS — R2689 Other abnormalities of gait and mobility: Secondary | ICD-10-CM | POA: Diagnosis not present

## 2017-03-14 DIAGNOSIS — R262 Difficulty in walking, not elsewhere classified: Secondary | ICD-10-CM | POA: Diagnosis not present

## 2017-03-14 DIAGNOSIS — I5030 Unspecified diastolic (congestive) heart failure: Secondary | ICD-10-CM | POA: Diagnosis not present

## 2017-03-14 DIAGNOSIS — E039 Hypothyroidism, unspecified: Secondary | ICD-10-CM | POA: Diagnosis not present

## 2017-03-14 DIAGNOSIS — M25561 Pain in right knee: Secondary | ICD-10-CM | POA: Diagnosis not present

## 2017-03-14 DIAGNOSIS — E46 Unspecified protein-calorie malnutrition: Secondary | ICD-10-CM | POA: Diagnosis not present

## 2017-03-14 DIAGNOSIS — I10 Essential (primary) hypertension: Secondary | ICD-10-CM | POA: Diagnosis not present

## 2017-03-14 DIAGNOSIS — R488 Other symbolic dysfunctions: Secondary | ICD-10-CM | POA: Diagnosis not present

## 2017-03-14 DIAGNOSIS — M25562 Pain in left knee: Secondary | ICD-10-CM | POA: Diagnosis not present

## 2017-03-14 DIAGNOSIS — K219 Gastro-esophageal reflux disease without esophagitis: Secondary | ICD-10-CM | POA: Diagnosis not present

## 2017-03-14 DIAGNOSIS — R2689 Other abnormalities of gait and mobility: Secondary | ICD-10-CM | POA: Diagnosis not present

## 2017-03-14 DIAGNOSIS — M25569 Pain in unspecified knee: Secondary | ICD-10-CM | POA: Diagnosis not present

## 2017-03-14 DIAGNOSIS — M6281 Muscle weakness (generalized): Secondary | ICD-10-CM | POA: Diagnosis not present

## 2017-03-14 DIAGNOSIS — I4891 Unspecified atrial fibrillation: Secondary | ICD-10-CM | POA: Diagnosis not present

## 2017-03-14 DIAGNOSIS — R2681 Unsteadiness on feet: Secondary | ICD-10-CM | POA: Diagnosis not present

## 2017-03-15 DIAGNOSIS — E46 Unspecified protein-calorie malnutrition: Secondary | ICD-10-CM | POA: Diagnosis not present

## 2017-03-15 DIAGNOSIS — K219 Gastro-esophageal reflux disease without esophagitis: Secondary | ICD-10-CM | POA: Diagnosis not present

## 2017-03-15 DIAGNOSIS — M25569 Pain in unspecified knee: Secondary | ICD-10-CM | POA: Diagnosis not present

## 2017-03-15 DIAGNOSIS — I4891 Unspecified atrial fibrillation: Secondary | ICD-10-CM | POA: Diagnosis not present

## 2017-03-15 DIAGNOSIS — R2681 Unsteadiness on feet: Secondary | ICD-10-CM | POA: Diagnosis not present

## 2017-03-15 DIAGNOSIS — I5030 Unspecified diastolic (congestive) heart failure: Secondary | ICD-10-CM | POA: Diagnosis not present

## 2017-03-15 DIAGNOSIS — M25562 Pain in left knee: Secondary | ICD-10-CM | POA: Diagnosis not present

## 2017-03-15 DIAGNOSIS — R262 Difficulty in walking, not elsewhere classified: Secondary | ICD-10-CM | POA: Diagnosis not present

## 2017-03-15 DIAGNOSIS — M6281 Muscle weakness (generalized): Secondary | ICD-10-CM | POA: Diagnosis not present

## 2017-03-15 DIAGNOSIS — R2689 Other abnormalities of gait and mobility: Secondary | ICD-10-CM | POA: Diagnosis not present

## 2017-03-15 DIAGNOSIS — E039 Hypothyroidism, unspecified: Secondary | ICD-10-CM | POA: Diagnosis not present

## 2017-03-15 DIAGNOSIS — I10 Essential (primary) hypertension: Secondary | ICD-10-CM | POA: Diagnosis not present

## 2017-03-15 DIAGNOSIS — M25561 Pain in right knee: Secondary | ICD-10-CM | POA: Diagnosis not present

## 2017-03-15 DIAGNOSIS — R488 Other symbolic dysfunctions: Secondary | ICD-10-CM | POA: Diagnosis not present

## 2017-03-16 DIAGNOSIS — M6281 Muscle weakness (generalized): Secondary | ICD-10-CM | POA: Diagnosis not present

## 2017-03-16 DIAGNOSIS — M25561 Pain in right knee: Secondary | ICD-10-CM | POA: Diagnosis not present

## 2017-03-16 DIAGNOSIS — R488 Other symbolic dysfunctions: Secondary | ICD-10-CM | POA: Diagnosis not present

## 2017-03-16 DIAGNOSIS — R262 Difficulty in walking, not elsewhere classified: Secondary | ICD-10-CM | POA: Diagnosis not present

## 2017-03-16 DIAGNOSIS — M25562 Pain in left knee: Secondary | ICD-10-CM | POA: Diagnosis not present

## 2017-03-16 DIAGNOSIS — R2681 Unsteadiness on feet: Secondary | ICD-10-CM | POA: Diagnosis not present

## 2017-03-16 DIAGNOSIS — K219 Gastro-esophageal reflux disease without esophagitis: Secondary | ICD-10-CM | POA: Diagnosis not present

## 2017-03-16 DIAGNOSIS — I10 Essential (primary) hypertension: Secondary | ICD-10-CM | POA: Diagnosis not present

## 2017-03-16 DIAGNOSIS — I5030 Unspecified diastolic (congestive) heart failure: Secondary | ICD-10-CM | POA: Diagnosis not present

## 2017-03-16 DIAGNOSIS — E039 Hypothyroidism, unspecified: Secondary | ICD-10-CM | POA: Diagnosis not present

## 2017-03-16 DIAGNOSIS — M25569 Pain in unspecified knee: Secondary | ICD-10-CM | POA: Diagnosis not present

## 2017-03-16 DIAGNOSIS — E46 Unspecified protein-calorie malnutrition: Secondary | ICD-10-CM | POA: Diagnosis not present

## 2017-03-16 DIAGNOSIS — R2689 Other abnormalities of gait and mobility: Secondary | ICD-10-CM | POA: Diagnosis not present

## 2017-03-16 DIAGNOSIS — I4891 Unspecified atrial fibrillation: Secondary | ICD-10-CM | POA: Diagnosis not present

## 2017-04-05 DIAGNOSIS — I509 Heart failure, unspecified: Secondary | ICD-10-CM | POA: Diagnosis not present

## 2017-04-05 DIAGNOSIS — I4891 Unspecified atrial fibrillation: Secondary | ICD-10-CM | POA: Diagnosis not present

## 2017-04-14 DIAGNOSIS — D4981 Neoplasm of unspecified behavior of retina and choroid: Secondary | ICD-10-CM | POA: Diagnosis not present

## 2017-04-14 DIAGNOSIS — H2513 Age-related nuclear cataract, bilateral: Secondary | ICD-10-CM | POA: Diagnosis not present

## 2017-05-05 DIAGNOSIS — Z79899 Other long term (current) drug therapy: Secondary | ICD-10-CM | POA: Diagnosis not present

## 2017-05-10 DIAGNOSIS — E039 Hypothyroidism, unspecified: Secondary | ICD-10-CM | POA: Diagnosis not present

## 2017-05-10 DIAGNOSIS — Z6824 Body mass index (BMI) 24.0-24.9, adult: Secondary | ICD-10-CM | POA: Diagnosis not present

## 2017-05-10 DIAGNOSIS — I509 Heart failure, unspecified: Secondary | ICD-10-CM | POA: Diagnosis not present

## 2017-05-24 DIAGNOSIS — R21 Rash and other nonspecific skin eruption: Secondary | ICD-10-CM | POA: Diagnosis not present

## 2017-06-05 DIAGNOSIS — N649 Disorder of breast, unspecified: Secondary | ICD-10-CM | POA: Diagnosis not present

## 2017-06-05 DIAGNOSIS — Z853 Personal history of malignant neoplasm of breast: Secondary | ICD-10-CM | POA: Diagnosis not present

## 2017-06-15 DIAGNOSIS — Z79899 Other long term (current) drug therapy: Secondary | ICD-10-CM | POA: Diagnosis not present

## 2017-07-03 DIAGNOSIS — N6310 Unspecified lump in the right breast, unspecified quadrant: Secondary | ICD-10-CM | POA: Diagnosis not present

## 2017-07-03 DIAGNOSIS — Z853 Personal history of malignant neoplasm of breast: Secondary | ICD-10-CM | POA: Diagnosis not present

## 2017-07-05 DIAGNOSIS — I509 Heart failure, unspecified: Secondary | ICD-10-CM | POA: Diagnosis not present

## 2017-07-05 DIAGNOSIS — C50919 Malignant neoplasm of unspecified site of unspecified female breast: Secondary | ICD-10-CM | POA: Diagnosis not present

## 2017-07-05 DIAGNOSIS — I10 Essential (primary) hypertension: Secondary | ICD-10-CM | POA: Diagnosis not present

## 2017-07-05 DIAGNOSIS — Z6824 Body mass index (BMI) 24.0-24.9, adult: Secondary | ICD-10-CM | POA: Diagnosis not present

## 2017-08-09 DIAGNOSIS — I4891 Unspecified atrial fibrillation: Secondary | ICD-10-CM | POA: Diagnosis not present

## 2017-08-09 DIAGNOSIS — E46 Unspecified protein-calorie malnutrition: Secondary | ICD-10-CM | POA: Diagnosis not present

## 2017-08-09 DIAGNOSIS — R262 Difficulty in walking, not elsewhere classified: Secondary | ICD-10-CM | POA: Diagnosis not present

## 2017-08-09 DIAGNOSIS — R2689 Other abnormalities of gait and mobility: Secondary | ICD-10-CM | POA: Diagnosis not present

## 2017-08-09 DIAGNOSIS — I10 Essential (primary) hypertension: Secondary | ICD-10-CM | POA: Diagnosis not present

## 2017-08-09 DIAGNOSIS — R488 Other symbolic dysfunctions: Secondary | ICD-10-CM | POA: Diagnosis not present

## 2017-08-09 DIAGNOSIS — R1311 Dysphagia, oral phase: Secondary | ICD-10-CM | POA: Diagnosis not present

## 2017-08-09 DIAGNOSIS — R2681 Unsteadiness on feet: Secondary | ICD-10-CM | POA: Diagnosis not present

## 2017-08-09 DIAGNOSIS — M6281 Muscle weakness (generalized): Secondary | ICD-10-CM | POA: Diagnosis not present

## 2017-08-09 DIAGNOSIS — H2513 Age-related nuclear cataract, bilateral: Secondary | ICD-10-CM | POA: Diagnosis not present

## 2017-08-09 DIAGNOSIS — M25561 Pain in right knee: Secondary | ICD-10-CM | POA: Diagnosis not present

## 2017-08-09 DIAGNOSIS — K219 Gastro-esophageal reflux disease without esophagitis: Secondary | ICD-10-CM | POA: Diagnosis not present

## 2017-08-09 DIAGNOSIS — M25562 Pain in left knee: Secondary | ICD-10-CM | POA: Diagnosis not present

## 2017-08-09 DIAGNOSIS — I5030 Unspecified diastolic (congestive) heart failure: Secondary | ICD-10-CM | POA: Diagnosis not present

## 2017-08-09 DIAGNOSIS — E039 Hypothyroidism, unspecified: Secondary | ICD-10-CM | POA: Diagnosis not present

## 2017-08-11 DIAGNOSIS — H2513 Age-related nuclear cataract, bilateral: Secondary | ICD-10-CM | POA: Diagnosis not present

## 2017-08-11 DIAGNOSIS — M6281 Muscle weakness (generalized): Secondary | ICD-10-CM | POA: Diagnosis not present

## 2017-08-11 DIAGNOSIS — I4891 Unspecified atrial fibrillation: Secondary | ICD-10-CM | POA: Diagnosis not present

## 2017-08-11 DIAGNOSIS — I5030 Unspecified diastolic (congestive) heart failure: Secondary | ICD-10-CM | POA: Diagnosis not present

## 2017-08-11 DIAGNOSIS — M25561 Pain in right knee: Secondary | ICD-10-CM | POA: Diagnosis not present

## 2017-08-11 DIAGNOSIS — E039 Hypothyroidism, unspecified: Secondary | ICD-10-CM | POA: Diagnosis not present

## 2017-08-11 DIAGNOSIS — M25562 Pain in left knee: Secondary | ICD-10-CM | POA: Diagnosis not present

## 2017-08-11 DIAGNOSIS — R488 Other symbolic dysfunctions: Secondary | ICD-10-CM | POA: Diagnosis not present

## 2017-08-11 DIAGNOSIS — I10 Essential (primary) hypertension: Secondary | ICD-10-CM | POA: Diagnosis not present

## 2017-08-11 DIAGNOSIS — R262 Difficulty in walking, not elsewhere classified: Secondary | ICD-10-CM | POA: Diagnosis not present

## 2017-08-11 DIAGNOSIS — E46 Unspecified protein-calorie malnutrition: Secondary | ICD-10-CM | POA: Diagnosis not present

## 2017-08-11 DIAGNOSIS — R2689 Other abnormalities of gait and mobility: Secondary | ICD-10-CM | POA: Diagnosis not present

## 2017-08-11 DIAGNOSIS — R1311 Dysphagia, oral phase: Secondary | ICD-10-CM | POA: Diagnosis not present

## 2017-08-11 DIAGNOSIS — K219 Gastro-esophageal reflux disease without esophagitis: Secondary | ICD-10-CM | POA: Diagnosis not present

## 2017-08-11 DIAGNOSIS — R2681 Unsteadiness on feet: Secondary | ICD-10-CM | POA: Diagnosis not present

## 2017-08-14 DIAGNOSIS — H2513 Age-related nuclear cataract, bilateral: Secondary | ICD-10-CM | POA: Diagnosis not present

## 2017-08-14 DIAGNOSIS — M25561 Pain in right knee: Secondary | ICD-10-CM | POA: Diagnosis not present

## 2017-08-14 DIAGNOSIS — M25562 Pain in left knee: Secondary | ICD-10-CM | POA: Diagnosis not present

## 2017-08-14 DIAGNOSIS — R1311 Dysphagia, oral phase: Secondary | ICD-10-CM | POA: Diagnosis not present

## 2017-08-14 DIAGNOSIS — R2681 Unsteadiness on feet: Secondary | ICD-10-CM | POA: Diagnosis not present

## 2017-08-14 DIAGNOSIS — I10 Essential (primary) hypertension: Secondary | ICD-10-CM | POA: Diagnosis not present

## 2017-08-14 DIAGNOSIS — K219 Gastro-esophageal reflux disease without esophagitis: Secondary | ICD-10-CM | POA: Diagnosis not present

## 2017-08-14 DIAGNOSIS — R262 Difficulty in walking, not elsewhere classified: Secondary | ICD-10-CM | POA: Diagnosis not present

## 2017-08-14 DIAGNOSIS — R2689 Other abnormalities of gait and mobility: Secondary | ICD-10-CM | POA: Diagnosis not present

## 2017-08-14 DIAGNOSIS — E039 Hypothyroidism, unspecified: Secondary | ICD-10-CM | POA: Diagnosis not present

## 2017-08-14 DIAGNOSIS — M6281 Muscle weakness (generalized): Secondary | ICD-10-CM | POA: Diagnosis not present

## 2017-08-14 DIAGNOSIS — R488 Other symbolic dysfunctions: Secondary | ICD-10-CM | POA: Diagnosis not present

## 2017-08-14 DIAGNOSIS — E46 Unspecified protein-calorie malnutrition: Secondary | ICD-10-CM | POA: Diagnosis not present

## 2017-08-14 DIAGNOSIS — I4891 Unspecified atrial fibrillation: Secondary | ICD-10-CM | POA: Diagnosis not present

## 2017-08-14 DIAGNOSIS — I5030 Unspecified diastolic (congestive) heart failure: Secondary | ICD-10-CM | POA: Diagnosis not present

## 2017-08-16 DIAGNOSIS — M25561 Pain in right knee: Secondary | ICD-10-CM | POA: Diagnosis not present

## 2017-08-16 DIAGNOSIS — I509 Heart failure, unspecified: Secondary | ICD-10-CM | POA: Diagnosis not present

## 2017-08-16 DIAGNOSIS — I10 Essential (primary) hypertension: Secondary | ICD-10-CM | POA: Diagnosis not present

## 2017-08-16 DIAGNOSIS — M6281 Muscle weakness (generalized): Secondary | ICD-10-CM | POA: Diagnosis not present

## 2017-08-16 DIAGNOSIS — C50911 Malignant neoplasm of unspecified site of right female breast: Secondary | ICD-10-CM | POA: Diagnosis not present

## 2017-08-16 DIAGNOSIS — E039 Hypothyroidism, unspecified: Secondary | ICD-10-CM | POA: Diagnosis not present

## 2017-08-16 DIAGNOSIS — R1311 Dysphagia, oral phase: Secondary | ICD-10-CM | POA: Diagnosis not present

## 2017-08-16 DIAGNOSIS — I4891 Unspecified atrial fibrillation: Secondary | ICD-10-CM | POA: Diagnosis not present

## 2017-08-16 DIAGNOSIS — H2513 Age-related nuclear cataract, bilateral: Secondary | ICD-10-CM | POA: Diagnosis not present

## 2017-08-16 DIAGNOSIS — Z6823 Body mass index (BMI) 23.0-23.9, adult: Secondary | ICD-10-CM | POA: Diagnosis not present

## 2017-08-16 DIAGNOSIS — R488 Other symbolic dysfunctions: Secondary | ICD-10-CM | POA: Diagnosis not present

## 2017-08-16 DIAGNOSIS — E46 Unspecified protein-calorie malnutrition: Secondary | ICD-10-CM | POA: Diagnosis not present

## 2017-08-16 DIAGNOSIS — I5030 Unspecified diastolic (congestive) heart failure: Secondary | ICD-10-CM | POA: Diagnosis not present

## 2017-08-16 DIAGNOSIS — R2681 Unsteadiness on feet: Secondary | ICD-10-CM | POA: Diagnosis not present

## 2017-08-16 DIAGNOSIS — R262 Difficulty in walking, not elsewhere classified: Secondary | ICD-10-CM | POA: Diagnosis not present

## 2017-08-16 DIAGNOSIS — M25562 Pain in left knee: Secondary | ICD-10-CM | POA: Diagnosis not present

## 2017-08-16 DIAGNOSIS — K219 Gastro-esophageal reflux disease without esophagitis: Secondary | ICD-10-CM | POA: Diagnosis not present

## 2017-08-16 DIAGNOSIS — R2689 Other abnormalities of gait and mobility: Secondary | ICD-10-CM | POA: Diagnosis not present

## 2017-08-17 DIAGNOSIS — M6281 Muscle weakness (generalized): Secondary | ICD-10-CM | POA: Diagnosis not present

## 2017-08-17 DIAGNOSIS — R488 Other symbolic dysfunctions: Secondary | ICD-10-CM | POA: Diagnosis not present

## 2017-08-17 DIAGNOSIS — I5030 Unspecified diastolic (congestive) heart failure: Secondary | ICD-10-CM | POA: Diagnosis not present

## 2017-08-17 DIAGNOSIS — M25562 Pain in left knee: Secondary | ICD-10-CM | POA: Diagnosis not present

## 2017-08-17 DIAGNOSIS — E46 Unspecified protein-calorie malnutrition: Secondary | ICD-10-CM | POA: Diagnosis not present

## 2017-08-17 DIAGNOSIS — I4891 Unspecified atrial fibrillation: Secondary | ICD-10-CM | POA: Diagnosis not present

## 2017-08-17 DIAGNOSIS — E039 Hypothyroidism, unspecified: Secondary | ICD-10-CM | POA: Diagnosis not present

## 2017-08-17 DIAGNOSIS — R2689 Other abnormalities of gait and mobility: Secondary | ICD-10-CM | POA: Diagnosis not present

## 2017-08-17 DIAGNOSIS — R2681 Unsteadiness on feet: Secondary | ICD-10-CM | POA: Diagnosis not present

## 2017-08-17 DIAGNOSIS — I10 Essential (primary) hypertension: Secondary | ICD-10-CM | POA: Diagnosis not present

## 2017-08-17 DIAGNOSIS — R1311 Dysphagia, oral phase: Secondary | ICD-10-CM | POA: Diagnosis not present

## 2017-08-17 DIAGNOSIS — H2513 Age-related nuclear cataract, bilateral: Secondary | ICD-10-CM | POA: Diagnosis not present

## 2017-08-17 DIAGNOSIS — R262 Difficulty in walking, not elsewhere classified: Secondary | ICD-10-CM | POA: Diagnosis not present

## 2017-08-17 DIAGNOSIS — M25561 Pain in right knee: Secondary | ICD-10-CM | POA: Diagnosis not present

## 2017-08-17 DIAGNOSIS — K219 Gastro-esophageal reflux disease without esophagitis: Secondary | ICD-10-CM | POA: Diagnosis not present

## 2017-08-21 DIAGNOSIS — I5030 Unspecified diastolic (congestive) heart failure: Secondary | ICD-10-CM | POA: Diagnosis not present

## 2017-08-21 DIAGNOSIS — R1311 Dysphagia, oral phase: Secondary | ICD-10-CM | POA: Diagnosis not present

## 2017-08-21 DIAGNOSIS — R2689 Other abnormalities of gait and mobility: Secondary | ICD-10-CM | POA: Diagnosis not present

## 2017-08-21 DIAGNOSIS — E46 Unspecified protein-calorie malnutrition: Secondary | ICD-10-CM | POA: Diagnosis not present

## 2017-08-21 DIAGNOSIS — R2681 Unsteadiness on feet: Secondary | ICD-10-CM | POA: Diagnosis not present

## 2017-08-21 DIAGNOSIS — I10 Essential (primary) hypertension: Secondary | ICD-10-CM | POA: Diagnosis not present

## 2017-08-21 DIAGNOSIS — R262 Difficulty in walking, not elsewhere classified: Secondary | ICD-10-CM | POA: Diagnosis not present

## 2017-08-21 DIAGNOSIS — K219 Gastro-esophageal reflux disease without esophagitis: Secondary | ICD-10-CM | POA: Diagnosis not present

## 2017-08-21 DIAGNOSIS — M6281 Muscle weakness (generalized): Secondary | ICD-10-CM | POA: Diagnosis not present

## 2017-08-21 DIAGNOSIS — E039 Hypothyroidism, unspecified: Secondary | ICD-10-CM | POA: Diagnosis not present

## 2017-08-21 DIAGNOSIS — H2513 Age-related nuclear cataract, bilateral: Secondary | ICD-10-CM | POA: Diagnosis not present

## 2017-08-21 DIAGNOSIS — M25561 Pain in right knee: Secondary | ICD-10-CM | POA: Diagnosis not present

## 2017-08-21 DIAGNOSIS — R488 Other symbolic dysfunctions: Secondary | ICD-10-CM | POA: Diagnosis not present

## 2017-08-21 DIAGNOSIS — M25562 Pain in left knee: Secondary | ICD-10-CM | POA: Diagnosis not present

## 2017-08-21 DIAGNOSIS — I4891 Unspecified atrial fibrillation: Secondary | ICD-10-CM | POA: Diagnosis not present

## 2017-08-23 DIAGNOSIS — Z79899 Other long term (current) drug therapy: Secondary | ICD-10-CM | POA: Diagnosis not present

## 2017-09-14 DIAGNOSIS — I5030 Unspecified diastolic (congestive) heart failure: Secondary | ICD-10-CM | POA: Diagnosis not present

## 2017-09-14 DIAGNOSIS — R05 Cough: Secondary | ICD-10-CM | POA: Diagnosis not present

## 2017-09-15 DIAGNOSIS — I959 Hypotension, unspecified: Secondary | ICD-10-CM | POA: Diagnosis not present

## 2017-09-15 DIAGNOSIS — C50911 Malignant neoplasm of unspecified site of right female breast: Secondary | ICD-10-CM | POA: Diagnosis not present

## 2017-10-09 DIAGNOSIS — I110 Hypertensive heart disease with heart failure: Secondary | ICD-10-CM | POA: Diagnosis not present

## 2017-10-09 DIAGNOSIS — C50911 Malignant neoplasm of unspecified site of right female breast: Secondary | ICD-10-CM | POA: Diagnosis not present

## 2017-10-09 DIAGNOSIS — I4891 Unspecified atrial fibrillation: Secondary | ICD-10-CM | POA: Diagnosis not present

## 2017-11-06 DIAGNOSIS — Z79899 Other long term (current) drug therapy: Secondary | ICD-10-CM | POA: Diagnosis not present

## 2017-11-20 DIAGNOSIS — E039 Hypothyroidism, unspecified: Secondary | ICD-10-CM | POA: Diagnosis not present

## 2017-11-20 DIAGNOSIS — R262 Difficulty in walking, not elsewhere classified: Secondary | ICD-10-CM | POA: Diagnosis not present

## 2017-11-20 DIAGNOSIS — M6281 Muscle weakness (generalized): Secondary | ICD-10-CM | POA: Diagnosis not present

## 2017-11-20 DIAGNOSIS — M25561 Pain in right knee: Secondary | ICD-10-CM | POA: Diagnosis not present

## 2017-11-20 DIAGNOSIS — K219 Gastro-esophageal reflux disease without esophagitis: Secondary | ICD-10-CM | POA: Diagnosis not present

## 2017-11-20 DIAGNOSIS — R1311 Dysphagia, oral phase: Secondary | ICD-10-CM | POA: Diagnosis not present

## 2017-11-20 DIAGNOSIS — H2513 Age-related nuclear cataract, bilateral: Secondary | ICD-10-CM | POA: Diagnosis not present

## 2017-11-20 DIAGNOSIS — R2681 Unsteadiness on feet: Secondary | ICD-10-CM | POA: Diagnosis not present

## 2017-11-20 DIAGNOSIS — E46 Unspecified protein-calorie malnutrition: Secondary | ICD-10-CM | POA: Diagnosis not present

## 2017-11-20 DIAGNOSIS — M25562 Pain in left knee: Secondary | ICD-10-CM | POA: Diagnosis not present

## 2017-11-20 DIAGNOSIS — R278 Other lack of coordination: Secondary | ICD-10-CM | POA: Diagnosis not present

## 2017-11-20 DIAGNOSIS — I4891 Unspecified atrial fibrillation: Secondary | ICD-10-CM | POA: Diagnosis not present

## 2017-11-20 DIAGNOSIS — R488 Other symbolic dysfunctions: Secondary | ICD-10-CM | POA: Diagnosis not present

## 2017-11-20 DIAGNOSIS — I10 Essential (primary) hypertension: Secondary | ICD-10-CM | POA: Diagnosis not present

## 2017-11-20 DIAGNOSIS — I5030 Unspecified diastolic (congestive) heart failure: Secondary | ICD-10-CM | POA: Diagnosis not present

## 2017-11-20 DIAGNOSIS — R2689 Other abnormalities of gait and mobility: Secondary | ICD-10-CM | POA: Diagnosis not present

## 2017-11-21 DIAGNOSIS — H2513 Age-related nuclear cataract, bilateral: Secondary | ICD-10-CM | POA: Diagnosis not present

## 2017-11-21 DIAGNOSIS — M25561 Pain in right knee: Secondary | ICD-10-CM | POA: Diagnosis not present

## 2017-11-21 DIAGNOSIS — R1311 Dysphagia, oral phase: Secondary | ICD-10-CM | POA: Diagnosis not present

## 2017-11-21 DIAGNOSIS — R2689 Other abnormalities of gait and mobility: Secondary | ICD-10-CM | POA: Diagnosis not present

## 2017-11-21 DIAGNOSIS — R262 Difficulty in walking, not elsewhere classified: Secondary | ICD-10-CM | POA: Diagnosis not present

## 2017-11-21 DIAGNOSIS — R488 Other symbolic dysfunctions: Secondary | ICD-10-CM | POA: Diagnosis not present

## 2017-11-21 DIAGNOSIS — I5030 Unspecified diastolic (congestive) heart failure: Secondary | ICD-10-CM | POA: Diagnosis not present

## 2017-11-21 DIAGNOSIS — I4891 Unspecified atrial fibrillation: Secondary | ICD-10-CM | POA: Diagnosis not present

## 2017-11-21 DIAGNOSIS — E46 Unspecified protein-calorie malnutrition: Secondary | ICD-10-CM | POA: Diagnosis not present

## 2017-11-21 DIAGNOSIS — M6281 Muscle weakness (generalized): Secondary | ICD-10-CM | POA: Diagnosis not present

## 2017-11-21 DIAGNOSIS — R278 Other lack of coordination: Secondary | ICD-10-CM | POA: Diagnosis not present

## 2017-11-21 DIAGNOSIS — K219 Gastro-esophageal reflux disease without esophagitis: Secondary | ICD-10-CM | POA: Diagnosis not present

## 2017-11-21 DIAGNOSIS — M25562 Pain in left knee: Secondary | ICD-10-CM | POA: Diagnosis not present

## 2017-11-21 DIAGNOSIS — E039 Hypothyroidism, unspecified: Secondary | ICD-10-CM | POA: Diagnosis not present

## 2017-11-21 DIAGNOSIS — I10 Essential (primary) hypertension: Secondary | ICD-10-CM | POA: Diagnosis not present

## 2017-11-21 DIAGNOSIS — R2681 Unsteadiness on feet: Secondary | ICD-10-CM | POA: Diagnosis not present

## 2017-11-22 DIAGNOSIS — M6281 Muscle weakness (generalized): Secondary | ICD-10-CM | POA: Diagnosis not present

## 2017-11-22 DIAGNOSIS — R262 Difficulty in walking, not elsewhere classified: Secondary | ICD-10-CM | POA: Diagnosis not present

## 2017-11-22 DIAGNOSIS — R488 Other symbolic dysfunctions: Secondary | ICD-10-CM | POA: Diagnosis not present

## 2017-11-22 DIAGNOSIS — R2681 Unsteadiness on feet: Secondary | ICD-10-CM | POA: Diagnosis not present

## 2017-11-22 DIAGNOSIS — E039 Hypothyroidism, unspecified: Secondary | ICD-10-CM | POA: Diagnosis not present

## 2017-11-22 DIAGNOSIS — R2689 Other abnormalities of gait and mobility: Secondary | ICD-10-CM | POA: Diagnosis not present

## 2017-11-22 DIAGNOSIS — R278 Other lack of coordination: Secondary | ICD-10-CM | POA: Diagnosis not present

## 2017-11-22 DIAGNOSIS — I5030 Unspecified diastolic (congestive) heart failure: Secondary | ICD-10-CM | POA: Diagnosis not present

## 2017-11-22 DIAGNOSIS — M25562 Pain in left knee: Secondary | ICD-10-CM | POA: Diagnosis not present

## 2017-11-22 DIAGNOSIS — I10 Essential (primary) hypertension: Secondary | ICD-10-CM | POA: Diagnosis not present

## 2017-11-22 DIAGNOSIS — R1311 Dysphagia, oral phase: Secondary | ICD-10-CM | POA: Diagnosis not present

## 2017-11-22 DIAGNOSIS — K219 Gastro-esophageal reflux disease without esophagitis: Secondary | ICD-10-CM | POA: Diagnosis not present

## 2017-11-22 DIAGNOSIS — E46 Unspecified protein-calorie malnutrition: Secondary | ICD-10-CM | POA: Diagnosis not present

## 2017-11-22 DIAGNOSIS — I4891 Unspecified atrial fibrillation: Secondary | ICD-10-CM | POA: Diagnosis not present

## 2017-11-22 DIAGNOSIS — M25561 Pain in right knee: Secondary | ICD-10-CM | POA: Diagnosis not present

## 2017-11-22 DIAGNOSIS — H2513 Age-related nuclear cataract, bilateral: Secondary | ICD-10-CM | POA: Diagnosis not present

## 2017-11-23 DIAGNOSIS — I10 Essential (primary) hypertension: Secondary | ICD-10-CM | POA: Diagnosis not present

## 2017-11-23 DIAGNOSIS — I4891 Unspecified atrial fibrillation: Secondary | ICD-10-CM | POA: Diagnosis not present

## 2017-11-23 DIAGNOSIS — E46 Unspecified protein-calorie malnutrition: Secondary | ICD-10-CM | POA: Diagnosis not present

## 2017-11-23 DIAGNOSIS — M6281 Muscle weakness (generalized): Secondary | ICD-10-CM | POA: Diagnosis not present

## 2017-11-23 DIAGNOSIS — K219 Gastro-esophageal reflux disease without esophagitis: Secondary | ICD-10-CM | POA: Diagnosis not present

## 2017-11-23 DIAGNOSIS — M25562 Pain in left knee: Secondary | ICD-10-CM | POA: Diagnosis not present

## 2017-11-23 DIAGNOSIS — E039 Hypothyroidism, unspecified: Secondary | ICD-10-CM | POA: Diagnosis not present

## 2017-11-23 DIAGNOSIS — I5030 Unspecified diastolic (congestive) heart failure: Secondary | ICD-10-CM | POA: Diagnosis not present

## 2017-11-23 DIAGNOSIS — R2689 Other abnormalities of gait and mobility: Secondary | ICD-10-CM | POA: Diagnosis not present

## 2017-11-23 DIAGNOSIS — R278 Other lack of coordination: Secondary | ICD-10-CM | POA: Diagnosis not present

## 2017-11-23 DIAGNOSIS — R488 Other symbolic dysfunctions: Secondary | ICD-10-CM | POA: Diagnosis not present

## 2017-11-23 DIAGNOSIS — H2513 Age-related nuclear cataract, bilateral: Secondary | ICD-10-CM | POA: Diagnosis not present

## 2017-11-23 DIAGNOSIS — R262 Difficulty in walking, not elsewhere classified: Secondary | ICD-10-CM | POA: Diagnosis not present

## 2017-11-23 DIAGNOSIS — M25561 Pain in right knee: Secondary | ICD-10-CM | POA: Diagnosis not present

## 2017-11-23 DIAGNOSIS — R2681 Unsteadiness on feet: Secondary | ICD-10-CM | POA: Diagnosis not present

## 2017-11-23 DIAGNOSIS — R1311 Dysphagia, oral phase: Secondary | ICD-10-CM | POA: Diagnosis not present

## 2017-11-24 DIAGNOSIS — M25562 Pain in left knee: Secondary | ICD-10-CM | POA: Diagnosis not present

## 2017-11-24 DIAGNOSIS — I5030 Unspecified diastolic (congestive) heart failure: Secondary | ICD-10-CM | POA: Diagnosis not present

## 2017-11-24 DIAGNOSIS — H2513 Age-related nuclear cataract, bilateral: Secondary | ICD-10-CM | POA: Diagnosis not present

## 2017-11-24 DIAGNOSIS — Z1331 Encounter for screening for depression: Secondary | ICD-10-CM | POA: Diagnosis not present

## 2017-11-24 DIAGNOSIS — R2681 Unsteadiness on feet: Secondary | ICD-10-CM | POA: Diagnosis not present

## 2017-11-24 DIAGNOSIS — E039 Hypothyroidism, unspecified: Secondary | ICD-10-CM | POA: Diagnosis not present

## 2017-11-24 DIAGNOSIS — R488 Other symbolic dysfunctions: Secondary | ICD-10-CM | POA: Diagnosis not present

## 2017-11-24 DIAGNOSIS — R1311 Dysphagia, oral phase: Secondary | ICD-10-CM | POA: Diagnosis not present

## 2017-11-24 DIAGNOSIS — E46 Unspecified protein-calorie malnutrition: Secondary | ICD-10-CM | POA: Diagnosis not present

## 2017-11-24 DIAGNOSIS — K219 Gastro-esophageal reflux disease without esophagitis: Secondary | ICD-10-CM | POA: Diagnosis not present

## 2017-11-24 DIAGNOSIS — I10 Essential (primary) hypertension: Secondary | ICD-10-CM | POA: Diagnosis not present

## 2017-11-24 DIAGNOSIS — M25561 Pain in right knee: Secondary | ICD-10-CM | POA: Diagnosis not present

## 2017-11-24 DIAGNOSIS — M6281 Muscle weakness (generalized): Secondary | ICD-10-CM | POA: Diagnosis not present

## 2017-11-24 DIAGNOSIS — R278 Other lack of coordination: Secondary | ICD-10-CM | POA: Diagnosis not present

## 2017-11-24 DIAGNOSIS — Z9181 History of falling: Secondary | ICD-10-CM | POA: Diagnosis not present

## 2017-11-24 DIAGNOSIS — I4891 Unspecified atrial fibrillation: Secondary | ICD-10-CM | POA: Diagnosis not present

## 2017-11-24 DIAGNOSIS — R262 Difficulty in walking, not elsewhere classified: Secondary | ICD-10-CM | POA: Diagnosis not present

## 2017-11-24 DIAGNOSIS — Z139 Encounter for screening, unspecified: Secondary | ICD-10-CM | POA: Diagnosis not present

## 2017-11-24 DIAGNOSIS — R2689 Other abnormalities of gait and mobility: Secondary | ICD-10-CM | POA: Diagnosis not present

## 2017-11-24 DIAGNOSIS — Z0000 Encounter for general adult medical examination without abnormal findings: Secondary | ICD-10-CM | POA: Diagnosis not present

## 2017-11-27 DIAGNOSIS — I4891 Unspecified atrial fibrillation: Secondary | ICD-10-CM | POA: Diagnosis not present

## 2017-11-27 DIAGNOSIS — I10 Essential (primary) hypertension: Secondary | ICD-10-CM | POA: Diagnosis not present

## 2017-11-27 DIAGNOSIS — M25561 Pain in right knee: Secondary | ICD-10-CM | POA: Diagnosis not present

## 2017-11-27 DIAGNOSIS — K219 Gastro-esophageal reflux disease without esophagitis: Secondary | ICD-10-CM | POA: Diagnosis not present

## 2017-11-27 DIAGNOSIS — R2689 Other abnormalities of gait and mobility: Secondary | ICD-10-CM | POA: Diagnosis not present

## 2017-11-27 DIAGNOSIS — R1311 Dysphagia, oral phase: Secondary | ICD-10-CM | POA: Diagnosis not present

## 2017-11-27 DIAGNOSIS — E039 Hypothyroidism, unspecified: Secondary | ICD-10-CM | POA: Diagnosis not present

## 2017-11-27 DIAGNOSIS — R488 Other symbolic dysfunctions: Secondary | ICD-10-CM | POA: Diagnosis not present

## 2017-11-27 DIAGNOSIS — I5030 Unspecified diastolic (congestive) heart failure: Secondary | ICD-10-CM | POA: Diagnosis not present

## 2017-11-27 DIAGNOSIS — M6281 Muscle weakness (generalized): Secondary | ICD-10-CM | POA: Diagnosis not present

## 2017-11-27 DIAGNOSIS — R278 Other lack of coordination: Secondary | ICD-10-CM | POA: Diagnosis not present

## 2017-11-27 DIAGNOSIS — R262 Difficulty in walking, not elsewhere classified: Secondary | ICD-10-CM | POA: Diagnosis not present

## 2017-11-27 DIAGNOSIS — E46 Unspecified protein-calorie malnutrition: Secondary | ICD-10-CM | POA: Diagnosis not present

## 2017-11-27 DIAGNOSIS — M25562 Pain in left knee: Secondary | ICD-10-CM | POA: Diagnosis not present

## 2017-11-27 DIAGNOSIS — R2681 Unsteadiness on feet: Secondary | ICD-10-CM | POA: Diagnosis not present

## 2017-11-27 DIAGNOSIS — H2513 Age-related nuclear cataract, bilateral: Secondary | ICD-10-CM | POA: Diagnosis not present

## 2017-11-29 DIAGNOSIS — R2689 Other abnormalities of gait and mobility: Secondary | ICD-10-CM | POA: Diagnosis not present

## 2017-11-29 DIAGNOSIS — I4891 Unspecified atrial fibrillation: Secondary | ICD-10-CM | POA: Diagnosis not present

## 2017-11-29 DIAGNOSIS — M25562 Pain in left knee: Secondary | ICD-10-CM | POA: Diagnosis not present

## 2017-11-29 DIAGNOSIS — H2513 Age-related nuclear cataract, bilateral: Secondary | ICD-10-CM | POA: Diagnosis not present

## 2017-11-29 DIAGNOSIS — R488 Other symbolic dysfunctions: Secondary | ICD-10-CM | POA: Diagnosis not present

## 2017-11-29 DIAGNOSIS — R2681 Unsteadiness on feet: Secondary | ICD-10-CM | POA: Diagnosis not present

## 2017-11-29 DIAGNOSIS — M6281 Muscle weakness (generalized): Secondary | ICD-10-CM | POA: Diagnosis not present

## 2017-11-29 DIAGNOSIS — R262 Difficulty in walking, not elsewhere classified: Secondary | ICD-10-CM | POA: Diagnosis not present

## 2017-11-29 DIAGNOSIS — R1311 Dysphagia, oral phase: Secondary | ICD-10-CM | POA: Diagnosis not present

## 2017-11-29 DIAGNOSIS — R278 Other lack of coordination: Secondary | ICD-10-CM | POA: Diagnosis not present

## 2017-11-29 DIAGNOSIS — E46 Unspecified protein-calorie malnutrition: Secondary | ICD-10-CM | POA: Diagnosis not present

## 2017-11-29 DIAGNOSIS — E039 Hypothyroidism, unspecified: Secondary | ICD-10-CM | POA: Diagnosis not present

## 2017-11-29 DIAGNOSIS — M25561 Pain in right knee: Secondary | ICD-10-CM | POA: Diagnosis not present

## 2017-11-29 DIAGNOSIS — K219 Gastro-esophageal reflux disease without esophagitis: Secondary | ICD-10-CM | POA: Diagnosis not present

## 2017-11-29 DIAGNOSIS — I5030 Unspecified diastolic (congestive) heart failure: Secondary | ICD-10-CM | POA: Diagnosis not present

## 2017-11-29 DIAGNOSIS — I10 Essential (primary) hypertension: Secondary | ICD-10-CM | POA: Diagnosis not present

## 2017-11-30 DIAGNOSIS — K219 Gastro-esophageal reflux disease without esophagitis: Secondary | ICD-10-CM | POA: Diagnosis not present

## 2017-11-30 DIAGNOSIS — M6281 Muscle weakness (generalized): Secondary | ICD-10-CM | POA: Diagnosis not present

## 2017-11-30 DIAGNOSIS — R488 Other symbolic dysfunctions: Secondary | ICD-10-CM | POA: Diagnosis not present

## 2017-11-30 DIAGNOSIS — R1311 Dysphagia, oral phase: Secondary | ICD-10-CM | POA: Diagnosis not present

## 2017-11-30 DIAGNOSIS — R262 Difficulty in walking, not elsewhere classified: Secondary | ICD-10-CM | POA: Diagnosis not present

## 2017-11-30 DIAGNOSIS — I5030 Unspecified diastolic (congestive) heart failure: Secondary | ICD-10-CM | POA: Diagnosis not present

## 2017-11-30 DIAGNOSIS — M25561 Pain in right knee: Secondary | ICD-10-CM | POA: Diagnosis not present

## 2017-11-30 DIAGNOSIS — I10 Essential (primary) hypertension: Secondary | ICD-10-CM | POA: Diagnosis not present

## 2017-11-30 DIAGNOSIS — R278 Other lack of coordination: Secondary | ICD-10-CM | POA: Diagnosis not present

## 2017-11-30 DIAGNOSIS — H2513 Age-related nuclear cataract, bilateral: Secondary | ICD-10-CM | POA: Diagnosis not present

## 2017-11-30 DIAGNOSIS — E46 Unspecified protein-calorie malnutrition: Secondary | ICD-10-CM | POA: Diagnosis not present

## 2017-11-30 DIAGNOSIS — E039 Hypothyroidism, unspecified: Secondary | ICD-10-CM | POA: Diagnosis not present

## 2017-11-30 DIAGNOSIS — R2681 Unsteadiness on feet: Secondary | ICD-10-CM | POA: Diagnosis not present

## 2017-11-30 DIAGNOSIS — M25562 Pain in left knee: Secondary | ICD-10-CM | POA: Diagnosis not present

## 2017-11-30 DIAGNOSIS — R2689 Other abnormalities of gait and mobility: Secondary | ICD-10-CM | POA: Diagnosis not present

## 2017-11-30 DIAGNOSIS — I4891 Unspecified atrial fibrillation: Secondary | ICD-10-CM | POA: Diagnosis not present

## 2017-12-01 DIAGNOSIS — I4891 Unspecified atrial fibrillation: Secondary | ICD-10-CM | POA: Diagnosis not present

## 2017-12-01 DIAGNOSIS — M25561 Pain in right knee: Secondary | ICD-10-CM | POA: Diagnosis not present

## 2017-12-01 DIAGNOSIS — M25562 Pain in left knee: Secondary | ICD-10-CM | POA: Diagnosis not present

## 2017-12-01 DIAGNOSIS — R278 Other lack of coordination: Secondary | ICD-10-CM | POA: Diagnosis not present

## 2017-12-01 DIAGNOSIS — H2513 Age-related nuclear cataract, bilateral: Secondary | ICD-10-CM | POA: Diagnosis not present

## 2017-12-01 DIAGNOSIS — R1311 Dysphagia, oral phase: Secondary | ICD-10-CM | POA: Diagnosis not present

## 2017-12-01 DIAGNOSIS — E039 Hypothyroidism, unspecified: Secondary | ICD-10-CM | POA: Diagnosis not present

## 2017-12-01 DIAGNOSIS — R2681 Unsteadiness on feet: Secondary | ICD-10-CM | POA: Diagnosis not present

## 2017-12-01 DIAGNOSIS — E46 Unspecified protein-calorie malnutrition: Secondary | ICD-10-CM | POA: Diagnosis not present

## 2017-12-01 DIAGNOSIS — K219 Gastro-esophageal reflux disease without esophagitis: Secondary | ICD-10-CM | POA: Diagnosis not present

## 2017-12-01 DIAGNOSIS — R2689 Other abnormalities of gait and mobility: Secondary | ICD-10-CM | POA: Diagnosis not present

## 2017-12-01 DIAGNOSIS — I10 Essential (primary) hypertension: Secondary | ICD-10-CM | POA: Diagnosis not present

## 2017-12-01 DIAGNOSIS — I5030 Unspecified diastolic (congestive) heart failure: Secondary | ICD-10-CM | POA: Diagnosis not present

## 2017-12-01 DIAGNOSIS — R262 Difficulty in walking, not elsewhere classified: Secondary | ICD-10-CM | POA: Diagnosis not present

## 2017-12-01 DIAGNOSIS — R488 Other symbolic dysfunctions: Secondary | ICD-10-CM | POA: Diagnosis not present

## 2017-12-01 DIAGNOSIS — M6281 Muscle weakness (generalized): Secondary | ICD-10-CM | POA: Diagnosis not present

## 2017-12-04 DIAGNOSIS — I10 Essential (primary) hypertension: Secondary | ICD-10-CM | POA: Diagnosis not present

## 2017-12-04 DIAGNOSIS — I4891 Unspecified atrial fibrillation: Secondary | ICD-10-CM | POA: Diagnosis not present

## 2017-12-04 DIAGNOSIS — R2689 Other abnormalities of gait and mobility: Secondary | ICD-10-CM | POA: Diagnosis not present

## 2017-12-04 DIAGNOSIS — R278 Other lack of coordination: Secondary | ICD-10-CM | POA: Diagnosis not present

## 2017-12-04 DIAGNOSIS — R262 Difficulty in walking, not elsewhere classified: Secondary | ICD-10-CM | POA: Diagnosis not present

## 2017-12-04 DIAGNOSIS — R2681 Unsteadiness on feet: Secondary | ICD-10-CM | POA: Diagnosis not present

## 2017-12-04 DIAGNOSIS — M6281 Muscle weakness (generalized): Secondary | ICD-10-CM | POA: Diagnosis not present

## 2017-12-04 DIAGNOSIS — H2513 Age-related nuclear cataract, bilateral: Secondary | ICD-10-CM | POA: Diagnosis not present

## 2017-12-04 DIAGNOSIS — M25562 Pain in left knee: Secondary | ICD-10-CM | POA: Diagnosis not present

## 2017-12-04 DIAGNOSIS — K219 Gastro-esophageal reflux disease without esophagitis: Secondary | ICD-10-CM | POA: Diagnosis not present

## 2017-12-04 DIAGNOSIS — E46 Unspecified protein-calorie malnutrition: Secondary | ICD-10-CM | POA: Diagnosis not present

## 2017-12-04 DIAGNOSIS — R1311 Dysphagia, oral phase: Secondary | ICD-10-CM | POA: Diagnosis not present

## 2017-12-04 DIAGNOSIS — R488 Other symbolic dysfunctions: Secondary | ICD-10-CM | POA: Diagnosis not present

## 2017-12-04 DIAGNOSIS — I5030 Unspecified diastolic (congestive) heart failure: Secondary | ICD-10-CM | POA: Diagnosis not present

## 2017-12-04 DIAGNOSIS — E039 Hypothyroidism, unspecified: Secondary | ICD-10-CM | POA: Diagnosis not present

## 2017-12-04 DIAGNOSIS — M25561 Pain in right knee: Secondary | ICD-10-CM | POA: Diagnosis not present

## 2017-12-06 DIAGNOSIS — E46 Unspecified protein-calorie malnutrition: Secondary | ICD-10-CM | POA: Diagnosis not present

## 2017-12-06 DIAGNOSIS — H2513 Age-related nuclear cataract, bilateral: Secondary | ICD-10-CM | POA: Diagnosis not present

## 2017-12-06 DIAGNOSIS — E039 Hypothyroidism, unspecified: Secondary | ICD-10-CM | POA: Diagnosis not present

## 2017-12-06 DIAGNOSIS — I4891 Unspecified atrial fibrillation: Secondary | ICD-10-CM | POA: Diagnosis not present

## 2017-12-06 DIAGNOSIS — R1311 Dysphagia, oral phase: Secondary | ICD-10-CM | POA: Diagnosis not present

## 2017-12-06 DIAGNOSIS — I5030 Unspecified diastolic (congestive) heart failure: Secondary | ICD-10-CM | POA: Diagnosis not present

## 2017-12-06 DIAGNOSIS — M6281 Muscle weakness (generalized): Secondary | ICD-10-CM | POA: Diagnosis not present

## 2017-12-06 DIAGNOSIS — K219 Gastro-esophageal reflux disease without esophagitis: Secondary | ICD-10-CM | POA: Diagnosis not present

## 2017-12-06 DIAGNOSIS — M25561 Pain in right knee: Secondary | ICD-10-CM | POA: Diagnosis not present

## 2017-12-06 DIAGNOSIS — R278 Other lack of coordination: Secondary | ICD-10-CM | POA: Diagnosis not present

## 2017-12-06 DIAGNOSIS — M25562 Pain in left knee: Secondary | ICD-10-CM | POA: Diagnosis not present

## 2017-12-06 DIAGNOSIS — I10 Essential (primary) hypertension: Secondary | ICD-10-CM | POA: Diagnosis not present

## 2017-12-06 DIAGNOSIS — R488 Other symbolic dysfunctions: Secondary | ICD-10-CM | POA: Diagnosis not present

## 2017-12-06 DIAGNOSIS — R262 Difficulty in walking, not elsewhere classified: Secondary | ICD-10-CM | POA: Diagnosis not present

## 2017-12-06 DIAGNOSIS — R2689 Other abnormalities of gait and mobility: Secondary | ICD-10-CM | POA: Diagnosis not present

## 2017-12-06 DIAGNOSIS — R2681 Unsteadiness on feet: Secondary | ICD-10-CM | POA: Diagnosis not present

## 2017-12-07 DIAGNOSIS — M25562 Pain in left knee: Secondary | ICD-10-CM | POA: Diagnosis not present

## 2017-12-07 DIAGNOSIS — I4891 Unspecified atrial fibrillation: Secondary | ICD-10-CM | POA: Diagnosis not present

## 2017-12-07 DIAGNOSIS — E039 Hypothyroidism, unspecified: Secondary | ICD-10-CM | POA: Diagnosis not present

## 2017-12-07 DIAGNOSIS — M6281 Muscle weakness (generalized): Secondary | ICD-10-CM | POA: Diagnosis not present

## 2017-12-07 DIAGNOSIS — M25561 Pain in right knee: Secondary | ICD-10-CM | POA: Diagnosis not present

## 2017-12-07 DIAGNOSIS — R2689 Other abnormalities of gait and mobility: Secondary | ICD-10-CM | POA: Diagnosis not present

## 2017-12-07 DIAGNOSIS — H2513 Age-related nuclear cataract, bilateral: Secondary | ICD-10-CM | POA: Diagnosis not present

## 2017-12-07 DIAGNOSIS — R1311 Dysphagia, oral phase: Secondary | ICD-10-CM | POA: Diagnosis not present

## 2017-12-07 DIAGNOSIS — R488 Other symbolic dysfunctions: Secondary | ICD-10-CM | POA: Diagnosis not present

## 2017-12-07 DIAGNOSIS — I10 Essential (primary) hypertension: Secondary | ICD-10-CM | POA: Diagnosis not present

## 2017-12-07 DIAGNOSIS — R262 Difficulty in walking, not elsewhere classified: Secondary | ICD-10-CM | POA: Diagnosis not present

## 2017-12-07 DIAGNOSIS — K219 Gastro-esophageal reflux disease without esophagitis: Secondary | ICD-10-CM | POA: Diagnosis not present

## 2017-12-07 DIAGNOSIS — E46 Unspecified protein-calorie malnutrition: Secondary | ICD-10-CM | POA: Diagnosis not present

## 2017-12-07 DIAGNOSIS — R278 Other lack of coordination: Secondary | ICD-10-CM | POA: Diagnosis not present

## 2017-12-07 DIAGNOSIS — I5030 Unspecified diastolic (congestive) heart failure: Secondary | ICD-10-CM | POA: Diagnosis not present

## 2017-12-07 DIAGNOSIS — R2681 Unsteadiness on feet: Secondary | ICD-10-CM | POA: Diagnosis not present

## 2017-12-08 DIAGNOSIS — I4891 Unspecified atrial fibrillation: Secondary | ICD-10-CM | POA: Diagnosis not present

## 2017-12-08 DIAGNOSIS — R488 Other symbolic dysfunctions: Secondary | ICD-10-CM | POA: Diagnosis not present

## 2017-12-08 DIAGNOSIS — R1311 Dysphagia, oral phase: Secondary | ICD-10-CM | POA: Diagnosis not present

## 2017-12-08 DIAGNOSIS — M25562 Pain in left knee: Secondary | ICD-10-CM | POA: Diagnosis not present

## 2017-12-08 DIAGNOSIS — I10 Essential (primary) hypertension: Secondary | ICD-10-CM | POA: Diagnosis not present

## 2017-12-08 DIAGNOSIS — R2689 Other abnormalities of gait and mobility: Secondary | ICD-10-CM | POA: Diagnosis not present

## 2017-12-08 DIAGNOSIS — E039 Hypothyroidism, unspecified: Secondary | ICD-10-CM | POA: Diagnosis not present

## 2017-12-08 DIAGNOSIS — E46 Unspecified protein-calorie malnutrition: Secondary | ICD-10-CM | POA: Diagnosis not present

## 2017-12-08 DIAGNOSIS — R2681 Unsteadiness on feet: Secondary | ICD-10-CM | POA: Diagnosis not present

## 2017-12-08 DIAGNOSIS — R262 Difficulty in walking, not elsewhere classified: Secondary | ICD-10-CM | POA: Diagnosis not present

## 2017-12-08 DIAGNOSIS — R278 Other lack of coordination: Secondary | ICD-10-CM | POA: Diagnosis not present

## 2017-12-08 DIAGNOSIS — M25561 Pain in right knee: Secondary | ICD-10-CM | POA: Diagnosis not present

## 2017-12-08 DIAGNOSIS — H2513 Age-related nuclear cataract, bilateral: Secondary | ICD-10-CM | POA: Diagnosis not present

## 2017-12-08 DIAGNOSIS — I5030 Unspecified diastolic (congestive) heart failure: Secondary | ICD-10-CM | POA: Diagnosis not present

## 2017-12-08 DIAGNOSIS — K219 Gastro-esophageal reflux disease without esophagitis: Secondary | ICD-10-CM | POA: Diagnosis not present

## 2017-12-08 DIAGNOSIS — M6281 Muscle weakness (generalized): Secondary | ICD-10-CM | POA: Diagnosis not present

## 2017-12-11 DIAGNOSIS — M6281 Muscle weakness (generalized): Secondary | ICD-10-CM | POA: Diagnosis not present

## 2017-12-11 DIAGNOSIS — R2681 Unsteadiness on feet: Secondary | ICD-10-CM | POA: Diagnosis not present

## 2017-12-11 DIAGNOSIS — E46 Unspecified protein-calorie malnutrition: Secondary | ICD-10-CM | POA: Diagnosis not present

## 2017-12-11 DIAGNOSIS — H2513 Age-related nuclear cataract, bilateral: Secondary | ICD-10-CM | POA: Diagnosis not present

## 2017-12-11 DIAGNOSIS — R1311 Dysphagia, oral phase: Secondary | ICD-10-CM | POA: Diagnosis not present

## 2017-12-11 DIAGNOSIS — I10 Essential (primary) hypertension: Secondary | ICD-10-CM | POA: Diagnosis not present

## 2017-12-11 DIAGNOSIS — R262 Difficulty in walking, not elsewhere classified: Secondary | ICD-10-CM | POA: Diagnosis not present

## 2017-12-11 DIAGNOSIS — R278 Other lack of coordination: Secondary | ICD-10-CM | POA: Diagnosis not present

## 2017-12-11 DIAGNOSIS — K219 Gastro-esophageal reflux disease without esophagitis: Secondary | ICD-10-CM | POA: Diagnosis not present

## 2017-12-11 DIAGNOSIS — R2689 Other abnormalities of gait and mobility: Secondary | ICD-10-CM | POA: Diagnosis not present

## 2017-12-11 DIAGNOSIS — I5030 Unspecified diastolic (congestive) heart failure: Secondary | ICD-10-CM | POA: Diagnosis not present

## 2017-12-11 DIAGNOSIS — M25562 Pain in left knee: Secondary | ICD-10-CM | POA: Diagnosis not present

## 2017-12-11 DIAGNOSIS — E039 Hypothyroidism, unspecified: Secondary | ICD-10-CM | POA: Diagnosis not present

## 2017-12-11 DIAGNOSIS — I4891 Unspecified atrial fibrillation: Secondary | ICD-10-CM | POA: Diagnosis not present

## 2017-12-11 DIAGNOSIS — M25561 Pain in right knee: Secondary | ICD-10-CM | POA: Diagnosis not present

## 2017-12-11 DIAGNOSIS — R488 Other symbolic dysfunctions: Secondary | ICD-10-CM | POA: Diagnosis not present

## 2017-12-12 DIAGNOSIS — R2689 Other abnormalities of gait and mobility: Secondary | ICD-10-CM | POA: Diagnosis not present

## 2017-12-12 DIAGNOSIS — R488 Other symbolic dysfunctions: Secondary | ICD-10-CM | POA: Diagnosis not present

## 2017-12-12 DIAGNOSIS — E46 Unspecified protein-calorie malnutrition: Secondary | ICD-10-CM | POA: Diagnosis not present

## 2017-12-12 DIAGNOSIS — M6281 Muscle weakness (generalized): Secondary | ICD-10-CM | POA: Diagnosis not present

## 2017-12-12 DIAGNOSIS — I10 Essential (primary) hypertension: Secondary | ICD-10-CM | POA: Diagnosis not present

## 2017-12-12 DIAGNOSIS — I4891 Unspecified atrial fibrillation: Secondary | ICD-10-CM | POA: Diagnosis not present

## 2017-12-12 DIAGNOSIS — M25561 Pain in right knee: Secondary | ICD-10-CM | POA: Diagnosis not present

## 2017-12-12 DIAGNOSIS — R2681 Unsteadiness on feet: Secondary | ICD-10-CM | POA: Diagnosis not present

## 2017-12-12 DIAGNOSIS — E039 Hypothyroidism, unspecified: Secondary | ICD-10-CM | POA: Diagnosis not present

## 2017-12-12 DIAGNOSIS — R278 Other lack of coordination: Secondary | ICD-10-CM | POA: Diagnosis not present

## 2017-12-12 DIAGNOSIS — R1311 Dysphagia, oral phase: Secondary | ICD-10-CM | POA: Diagnosis not present

## 2017-12-12 DIAGNOSIS — M25562 Pain in left knee: Secondary | ICD-10-CM | POA: Diagnosis not present

## 2017-12-12 DIAGNOSIS — I5030 Unspecified diastolic (congestive) heart failure: Secondary | ICD-10-CM | POA: Diagnosis not present

## 2017-12-12 DIAGNOSIS — K219 Gastro-esophageal reflux disease without esophagitis: Secondary | ICD-10-CM | POA: Diagnosis not present

## 2017-12-12 DIAGNOSIS — H2513 Age-related nuclear cataract, bilateral: Secondary | ICD-10-CM | POA: Diagnosis not present

## 2017-12-12 DIAGNOSIS — R262 Difficulty in walking, not elsewhere classified: Secondary | ICD-10-CM | POA: Diagnosis not present

## 2017-12-13 DIAGNOSIS — I5030 Unspecified diastolic (congestive) heart failure: Secondary | ICD-10-CM | POA: Diagnosis not present

## 2017-12-13 DIAGNOSIS — R2681 Unsteadiness on feet: Secondary | ICD-10-CM | POA: Diagnosis not present

## 2017-12-13 DIAGNOSIS — H2513 Age-related nuclear cataract, bilateral: Secondary | ICD-10-CM | POA: Diagnosis not present

## 2017-12-13 DIAGNOSIS — M25561 Pain in right knee: Secondary | ICD-10-CM | POA: Diagnosis not present

## 2017-12-13 DIAGNOSIS — R1311 Dysphagia, oral phase: Secondary | ICD-10-CM | POA: Diagnosis not present

## 2017-12-13 DIAGNOSIS — M6281 Muscle weakness (generalized): Secondary | ICD-10-CM | POA: Diagnosis not present

## 2017-12-13 DIAGNOSIS — E039 Hypothyroidism, unspecified: Secondary | ICD-10-CM | POA: Diagnosis not present

## 2017-12-13 DIAGNOSIS — K219 Gastro-esophageal reflux disease without esophagitis: Secondary | ICD-10-CM | POA: Diagnosis not present

## 2017-12-13 DIAGNOSIS — R488 Other symbolic dysfunctions: Secondary | ICD-10-CM | POA: Diagnosis not present

## 2017-12-13 DIAGNOSIS — I10 Essential (primary) hypertension: Secondary | ICD-10-CM | POA: Diagnosis not present

## 2017-12-13 DIAGNOSIS — R2689 Other abnormalities of gait and mobility: Secondary | ICD-10-CM | POA: Diagnosis not present

## 2017-12-13 DIAGNOSIS — R262 Difficulty in walking, not elsewhere classified: Secondary | ICD-10-CM | POA: Diagnosis not present

## 2017-12-13 DIAGNOSIS — I4891 Unspecified atrial fibrillation: Secondary | ICD-10-CM | POA: Diagnosis not present

## 2017-12-13 DIAGNOSIS — M25562 Pain in left knee: Secondary | ICD-10-CM | POA: Diagnosis not present

## 2017-12-13 DIAGNOSIS — R278 Other lack of coordination: Secondary | ICD-10-CM | POA: Diagnosis not present

## 2017-12-13 DIAGNOSIS — E46 Unspecified protein-calorie malnutrition: Secondary | ICD-10-CM | POA: Diagnosis not present

## 2017-12-14 DIAGNOSIS — E46 Unspecified protein-calorie malnutrition: Secondary | ICD-10-CM | POA: Diagnosis not present

## 2017-12-14 DIAGNOSIS — R1311 Dysphagia, oral phase: Secondary | ICD-10-CM | POA: Diagnosis not present

## 2017-12-14 DIAGNOSIS — R262 Difficulty in walking, not elsewhere classified: Secondary | ICD-10-CM | POA: Diagnosis not present

## 2017-12-14 DIAGNOSIS — M25561 Pain in right knee: Secondary | ICD-10-CM | POA: Diagnosis not present

## 2017-12-14 DIAGNOSIS — M25562 Pain in left knee: Secondary | ICD-10-CM | POA: Diagnosis not present

## 2017-12-14 DIAGNOSIS — H2513 Age-related nuclear cataract, bilateral: Secondary | ICD-10-CM | POA: Diagnosis not present

## 2017-12-14 DIAGNOSIS — M6281 Muscle weakness (generalized): Secondary | ICD-10-CM | POA: Diagnosis not present

## 2017-12-14 DIAGNOSIS — K219 Gastro-esophageal reflux disease without esophagitis: Secondary | ICD-10-CM | POA: Diagnosis not present

## 2017-12-14 DIAGNOSIS — R278 Other lack of coordination: Secondary | ICD-10-CM | POA: Diagnosis not present

## 2017-12-14 DIAGNOSIS — I10 Essential (primary) hypertension: Secondary | ICD-10-CM | POA: Diagnosis not present

## 2017-12-14 DIAGNOSIS — R488 Other symbolic dysfunctions: Secondary | ICD-10-CM | POA: Diagnosis not present

## 2017-12-14 DIAGNOSIS — I5030 Unspecified diastolic (congestive) heart failure: Secondary | ICD-10-CM | POA: Diagnosis not present

## 2017-12-14 DIAGNOSIS — R2681 Unsteadiness on feet: Secondary | ICD-10-CM | POA: Diagnosis not present

## 2017-12-14 DIAGNOSIS — I4891 Unspecified atrial fibrillation: Secondary | ICD-10-CM | POA: Diagnosis not present

## 2017-12-14 DIAGNOSIS — E039 Hypothyroidism, unspecified: Secondary | ICD-10-CM | POA: Diagnosis not present

## 2017-12-14 DIAGNOSIS — R2689 Other abnormalities of gait and mobility: Secondary | ICD-10-CM | POA: Diagnosis not present

## 2017-12-15 DIAGNOSIS — R1311 Dysphagia, oral phase: Secondary | ICD-10-CM | POA: Diagnosis not present

## 2017-12-15 DIAGNOSIS — I10 Essential (primary) hypertension: Secondary | ICD-10-CM | POA: Diagnosis not present

## 2017-12-15 DIAGNOSIS — M25562 Pain in left knee: Secondary | ICD-10-CM | POA: Diagnosis not present

## 2017-12-15 DIAGNOSIS — K219 Gastro-esophageal reflux disease without esophagitis: Secondary | ICD-10-CM | POA: Diagnosis not present

## 2017-12-15 DIAGNOSIS — R2689 Other abnormalities of gait and mobility: Secondary | ICD-10-CM | POA: Diagnosis not present

## 2017-12-15 DIAGNOSIS — I4891 Unspecified atrial fibrillation: Secondary | ICD-10-CM | POA: Diagnosis not present

## 2017-12-15 DIAGNOSIS — E46 Unspecified protein-calorie malnutrition: Secondary | ICD-10-CM | POA: Diagnosis not present

## 2017-12-15 DIAGNOSIS — H2513 Age-related nuclear cataract, bilateral: Secondary | ICD-10-CM | POA: Diagnosis not present

## 2017-12-15 DIAGNOSIS — I5030 Unspecified diastolic (congestive) heart failure: Secondary | ICD-10-CM | POA: Diagnosis not present

## 2017-12-15 DIAGNOSIS — M6281 Muscle weakness (generalized): Secondary | ICD-10-CM | POA: Diagnosis not present

## 2017-12-15 DIAGNOSIS — E039 Hypothyroidism, unspecified: Secondary | ICD-10-CM | POA: Diagnosis not present

## 2017-12-15 DIAGNOSIS — R278 Other lack of coordination: Secondary | ICD-10-CM | POA: Diagnosis not present

## 2017-12-15 DIAGNOSIS — R2681 Unsteadiness on feet: Secondary | ICD-10-CM | POA: Diagnosis not present

## 2017-12-15 DIAGNOSIS — R262 Difficulty in walking, not elsewhere classified: Secondary | ICD-10-CM | POA: Diagnosis not present

## 2017-12-15 DIAGNOSIS — R488 Other symbolic dysfunctions: Secondary | ICD-10-CM | POA: Diagnosis not present

## 2017-12-15 DIAGNOSIS — M25561 Pain in right knee: Secondary | ICD-10-CM | POA: Diagnosis not present

## 2017-12-18 DIAGNOSIS — E039 Hypothyroidism, unspecified: Secondary | ICD-10-CM | POA: Diagnosis not present

## 2017-12-18 DIAGNOSIS — R2681 Unsteadiness on feet: Secondary | ICD-10-CM | POA: Diagnosis not present

## 2017-12-18 DIAGNOSIS — I5030 Unspecified diastolic (congestive) heart failure: Secondary | ICD-10-CM | POA: Diagnosis not present

## 2017-12-18 DIAGNOSIS — R262 Difficulty in walking, not elsewhere classified: Secondary | ICD-10-CM | POA: Diagnosis not present

## 2017-12-18 DIAGNOSIS — K219 Gastro-esophageal reflux disease without esophagitis: Secondary | ICD-10-CM | POA: Diagnosis not present

## 2017-12-18 DIAGNOSIS — R488 Other symbolic dysfunctions: Secondary | ICD-10-CM | POA: Diagnosis not present

## 2017-12-18 DIAGNOSIS — I4891 Unspecified atrial fibrillation: Secondary | ICD-10-CM | POA: Diagnosis not present

## 2017-12-18 DIAGNOSIS — I10 Essential (primary) hypertension: Secondary | ICD-10-CM | POA: Diagnosis not present

## 2017-12-18 DIAGNOSIS — H2513 Age-related nuclear cataract, bilateral: Secondary | ICD-10-CM | POA: Diagnosis not present

## 2017-12-18 DIAGNOSIS — E46 Unspecified protein-calorie malnutrition: Secondary | ICD-10-CM | POA: Diagnosis not present

## 2017-12-18 DIAGNOSIS — M25561 Pain in right knee: Secondary | ICD-10-CM | POA: Diagnosis not present

## 2017-12-18 DIAGNOSIS — R278 Other lack of coordination: Secondary | ICD-10-CM | POA: Diagnosis not present

## 2017-12-18 DIAGNOSIS — R1311 Dysphagia, oral phase: Secondary | ICD-10-CM | POA: Diagnosis not present

## 2017-12-18 DIAGNOSIS — M6281 Muscle weakness (generalized): Secondary | ICD-10-CM | POA: Diagnosis not present

## 2017-12-18 DIAGNOSIS — R2689 Other abnormalities of gait and mobility: Secondary | ICD-10-CM | POA: Diagnosis not present

## 2017-12-18 DIAGNOSIS — M25562 Pain in left knee: Secondary | ICD-10-CM | POA: Diagnosis not present

## 2017-12-19 DIAGNOSIS — R1311 Dysphagia, oral phase: Secondary | ICD-10-CM | POA: Diagnosis not present

## 2017-12-19 DIAGNOSIS — R278 Other lack of coordination: Secondary | ICD-10-CM | POA: Diagnosis not present

## 2017-12-19 DIAGNOSIS — M25561 Pain in right knee: Secondary | ICD-10-CM | POA: Diagnosis not present

## 2017-12-19 DIAGNOSIS — R2681 Unsteadiness on feet: Secondary | ICD-10-CM | POA: Diagnosis not present

## 2017-12-19 DIAGNOSIS — H2513 Age-related nuclear cataract, bilateral: Secondary | ICD-10-CM | POA: Diagnosis not present

## 2017-12-19 DIAGNOSIS — E46 Unspecified protein-calorie malnutrition: Secondary | ICD-10-CM | POA: Diagnosis not present

## 2017-12-19 DIAGNOSIS — I4891 Unspecified atrial fibrillation: Secondary | ICD-10-CM | POA: Diagnosis not present

## 2017-12-19 DIAGNOSIS — I5030 Unspecified diastolic (congestive) heart failure: Secondary | ICD-10-CM | POA: Diagnosis not present

## 2017-12-19 DIAGNOSIS — M25562 Pain in left knee: Secondary | ICD-10-CM | POA: Diagnosis not present

## 2017-12-19 DIAGNOSIS — R488 Other symbolic dysfunctions: Secondary | ICD-10-CM | POA: Diagnosis not present

## 2017-12-19 DIAGNOSIS — E039 Hypothyroidism, unspecified: Secondary | ICD-10-CM | POA: Diagnosis not present

## 2017-12-19 DIAGNOSIS — R262 Difficulty in walking, not elsewhere classified: Secondary | ICD-10-CM | POA: Diagnosis not present

## 2017-12-19 DIAGNOSIS — M6281 Muscle weakness (generalized): Secondary | ICD-10-CM | POA: Diagnosis not present

## 2017-12-19 DIAGNOSIS — R2689 Other abnormalities of gait and mobility: Secondary | ICD-10-CM | POA: Diagnosis not present

## 2017-12-19 DIAGNOSIS — I10 Essential (primary) hypertension: Secondary | ICD-10-CM | POA: Diagnosis not present

## 2017-12-19 DIAGNOSIS — K219 Gastro-esophageal reflux disease without esophagitis: Secondary | ICD-10-CM | POA: Diagnosis not present

## 2017-12-20 DIAGNOSIS — M6281 Muscle weakness (generalized): Secondary | ICD-10-CM | POA: Diagnosis not present

## 2017-12-20 DIAGNOSIS — E039 Hypothyroidism, unspecified: Secondary | ICD-10-CM | POA: Diagnosis not present

## 2017-12-20 DIAGNOSIS — R488 Other symbolic dysfunctions: Secondary | ICD-10-CM | POA: Diagnosis not present

## 2017-12-20 DIAGNOSIS — R2689 Other abnormalities of gait and mobility: Secondary | ICD-10-CM | POA: Diagnosis not present

## 2017-12-20 DIAGNOSIS — H2513 Age-related nuclear cataract, bilateral: Secondary | ICD-10-CM | POA: Diagnosis not present

## 2017-12-20 DIAGNOSIS — K219 Gastro-esophageal reflux disease without esophagitis: Secondary | ICD-10-CM | POA: Diagnosis not present

## 2017-12-20 DIAGNOSIS — R278 Other lack of coordination: Secondary | ICD-10-CM | POA: Diagnosis not present

## 2017-12-20 DIAGNOSIS — R1311 Dysphagia, oral phase: Secondary | ICD-10-CM | POA: Diagnosis not present

## 2017-12-20 DIAGNOSIS — I10 Essential (primary) hypertension: Secondary | ICD-10-CM | POA: Diagnosis not present

## 2017-12-20 DIAGNOSIS — I5030 Unspecified diastolic (congestive) heart failure: Secondary | ICD-10-CM | POA: Diagnosis not present

## 2017-12-20 DIAGNOSIS — I4891 Unspecified atrial fibrillation: Secondary | ICD-10-CM | POA: Diagnosis not present

## 2017-12-20 DIAGNOSIS — M25562 Pain in left knee: Secondary | ICD-10-CM | POA: Diagnosis not present

## 2017-12-20 DIAGNOSIS — R262 Difficulty in walking, not elsewhere classified: Secondary | ICD-10-CM | POA: Diagnosis not present

## 2017-12-20 DIAGNOSIS — E46 Unspecified protein-calorie malnutrition: Secondary | ICD-10-CM | POA: Diagnosis not present

## 2017-12-20 DIAGNOSIS — M25561 Pain in right knee: Secondary | ICD-10-CM | POA: Diagnosis not present

## 2017-12-20 DIAGNOSIS — R2681 Unsteadiness on feet: Secondary | ICD-10-CM | POA: Diagnosis not present

## 2017-12-21 DIAGNOSIS — I10 Essential (primary) hypertension: Secondary | ICD-10-CM | POA: Diagnosis not present

## 2017-12-21 DIAGNOSIS — E46 Unspecified protein-calorie malnutrition: Secondary | ICD-10-CM | POA: Diagnosis not present

## 2017-12-21 DIAGNOSIS — M6281 Muscle weakness (generalized): Secondary | ICD-10-CM | POA: Diagnosis not present

## 2017-12-21 DIAGNOSIS — M25561 Pain in right knee: Secondary | ICD-10-CM | POA: Diagnosis not present

## 2017-12-21 DIAGNOSIS — R1311 Dysphagia, oral phase: Secondary | ICD-10-CM | POA: Diagnosis not present

## 2017-12-21 DIAGNOSIS — K219 Gastro-esophageal reflux disease without esophagitis: Secondary | ICD-10-CM | POA: Diagnosis not present

## 2017-12-21 DIAGNOSIS — R262 Difficulty in walking, not elsewhere classified: Secondary | ICD-10-CM | POA: Diagnosis not present

## 2017-12-21 DIAGNOSIS — H2513 Age-related nuclear cataract, bilateral: Secondary | ICD-10-CM | POA: Diagnosis not present

## 2017-12-21 DIAGNOSIS — I5030 Unspecified diastolic (congestive) heart failure: Secondary | ICD-10-CM | POA: Diagnosis not present

## 2017-12-21 DIAGNOSIS — E039 Hypothyroidism, unspecified: Secondary | ICD-10-CM | POA: Diagnosis not present

## 2017-12-21 DIAGNOSIS — R488 Other symbolic dysfunctions: Secondary | ICD-10-CM | POA: Diagnosis not present

## 2017-12-21 DIAGNOSIS — R278 Other lack of coordination: Secondary | ICD-10-CM | POA: Diagnosis not present

## 2017-12-21 DIAGNOSIS — I4891 Unspecified atrial fibrillation: Secondary | ICD-10-CM | POA: Diagnosis not present

## 2017-12-21 DIAGNOSIS — R2681 Unsteadiness on feet: Secondary | ICD-10-CM | POA: Diagnosis not present

## 2017-12-21 DIAGNOSIS — R2689 Other abnormalities of gait and mobility: Secondary | ICD-10-CM | POA: Diagnosis not present

## 2017-12-21 DIAGNOSIS — M25562 Pain in left knee: Secondary | ICD-10-CM | POA: Diagnosis not present

## 2017-12-22 DIAGNOSIS — K219 Gastro-esophageal reflux disease without esophagitis: Secondary | ICD-10-CM | POA: Diagnosis not present

## 2017-12-22 DIAGNOSIS — R278 Other lack of coordination: Secondary | ICD-10-CM | POA: Diagnosis not present

## 2017-12-22 DIAGNOSIS — M6281 Muscle weakness (generalized): Secondary | ICD-10-CM | POA: Diagnosis not present

## 2017-12-22 DIAGNOSIS — R1311 Dysphagia, oral phase: Secondary | ICD-10-CM | POA: Diagnosis not present

## 2017-12-22 DIAGNOSIS — M25561 Pain in right knee: Secondary | ICD-10-CM | POA: Diagnosis not present

## 2017-12-22 DIAGNOSIS — E039 Hypothyroidism, unspecified: Secondary | ICD-10-CM | POA: Diagnosis not present

## 2017-12-22 DIAGNOSIS — R2689 Other abnormalities of gait and mobility: Secondary | ICD-10-CM | POA: Diagnosis not present

## 2017-12-22 DIAGNOSIS — I5030 Unspecified diastolic (congestive) heart failure: Secondary | ICD-10-CM | POA: Diagnosis not present

## 2017-12-22 DIAGNOSIS — R488 Other symbolic dysfunctions: Secondary | ICD-10-CM | POA: Diagnosis not present

## 2017-12-22 DIAGNOSIS — R262 Difficulty in walking, not elsewhere classified: Secondary | ICD-10-CM | POA: Diagnosis not present

## 2017-12-22 DIAGNOSIS — R2681 Unsteadiness on feet: Secondary | ICD-10-CM | POA: Diagnosis not present

## 2017-12-22 DIAGNOSIS — E46 Unspecified protein-calorie malnutrition: Secondary | ICD-10-CM | POA: Diagnosis not present

## 2017-12-22 DIAGNOSIS — I4891 Unspecified atrial fibrillation: Secondary | ICD-10-CM | POA: Diagnosis not present

## 2017-12-22 DIAGNOSIS — Z79899 Other long term (current) drug therapy: Secondary | ICD-10-CM | POA: Diagnosis not present

## 2017-12-22 DIAGNOSIS — H2513 Age-related nuclear cataract, bilateral: Secondary | ICD-10-CM | POA: Diagnosis not present

## 2017-12-22 DIAGNOSIS — I10 Essential (primary) hypertension: Secondary | ICD-10-CM | POA: Diagnosis not present

## 2017-12-22 DIAGNOSIS — M25562 Pain in left knee: Secondary | ICD-10-CM | POA: Diagnosis not present

## 2017-12-27 DIAGNOSIS — R278 Other lack of coordination: Secondary | ICD-10-CM | POA: Diagnosis not present

## 2017-12-27 DIAGNOSIS — R2689 Other abnormalities of gait and mobility: Secondary | ICD-10-CM | POA: Diagnosis not present

## 2017-12-27 DIAGNOSIS — I10 Essential (primary) hypertension: Secondary | ICD-10-CM | POA: Diagnosis not present

## 2017-12-27 DIAGNOSIS — H2513 Age-related nuclear cataract, bilateral: Secondary | ICD-10-CM | POA: Diagnosis not present

## 2017-12-27 DIAGNOSIS — M25561 Pain in right knee: Secondary | ICD-10-CM | POA: Diagnosis not present

## 2017-12-27 DIAGNOSIS — E46 Unspecified protein-calorie malnutrition: Secondary | ICD-10-CM | POA: Diagnosis not present

## 2017-12-27 DIAGNOSIS — K219 Gastro-esophageal reflux disease without esophagitis: Secondary | ICD-10-CM | POA: Diagnosis not present

## 2017-12-27 DIAGNOSIS — I5030 Unspecified diastolic (congestive) heart failure: Secondary | ICD-10-CM | POA: Diagnosis not present

## 2017-12-27 DIAGNOSIS — E039 Hypothyroidism, unspecified: Secondary | ICD-10-CM | POA: Diagnosis not present

## 2017-12-27 DIAGNOSIS — M25562 Pain in left knee: Secondary | ICD-10-CM | POA: Diagnosis not present

## 2017-12-27 DIAGNOSIS — R2681 Unsteadiness on feet: Secondary | ICD-10-CM | POA: Diagnosis not present

## 2017-12-27 DIAGNOSIS — I4891 Unspecified atrial fibrillation: Secondary | ICD-10-CM | POA: Diagnosis not present

## 2017-12-27 DIAGNOSIS — R488 Other symbolic dysfunctions: Secondary | ICD-10-CM | POA: Diagnosis not present

## 2017-12-27 DIAGNOSIS — R1311 Dysphagia, oral phase: Secondary | ICD-10-CM | POA: Diagnosis not present

## 2017-12-27 DIAGNOSIS — M6281 Muscle weakness (generalized): Secondary | ICD-10-CM | POA: Diagnosis not present

## 2017-12-27 DIAGNOSIS — R262 Difficulty in walking, not elsewhere classified: Secondary | ICD-10-CM | POA: Diagnosis not present

## 2017-12-28 DIAGNOSIS — I4891 Unspecified atrial fibrillation: Secondary | ICD-10-CM | POA: Diagnosis not present

## 2017-12-28 DIAGNOSIS — I10 Essential (primary) hypertension: Secondary | ICD-10-CM | POA: Diagnosis not present

## 2017-12-28 DIAGNOSIS — M25561 Pain in right knee: Secondary | ICD-10-CM | POA: Diagnosis not present

## 2017-12-28 DIAGNOSIS — H2513 Age-related nuclear cataract, bilateral: Secondary | ICD-10-CM | POA: Diagnosis not present

## 2017-12-28 DIAGNOSIS — R1311 Dysphagia, oral phase: Secondary | ICD-10-CM | POA: Diagnosis not present

## 2017-12-28 DIAGNOSIS — R2689 Other abnormalities of gait and mobility: Secondary | ICD-10-CM | POA: Diagnosis not present

## 2017-12-28 DIAGNOSIS — R262 Difficulty in walking, not elsewhere classified: Secondary | ICD-10-CM | POA: Diagnosis not present

## 2017-12-28 DIAGNOSIS — E46 Unspecified protein-calorie malnutrition: Secondary | ICD-10-CM | POA: Diagnosis not present

## 2017-12-28 DIAGNOSIS — R488 Other symbolic dysfunctions: Secondary | ICD-10-CM | POA: Diagnosis not present

## 2017-12-28 DIAGNOSIS — R2681 Unsteadiness on feet: Secondary | ICD-10-CM | POA: Diagnosis not present

## 2017-12-28 DIAGNOSIS — R278 Other lack of coordination: Secondary | ICD-10-CM | POA: Diagnosis not present

## 2017-12-28 DIAGNOSIS — M6281 Muscle weakness (generalized): Secondary | ICD-10-CM | POA: Diagnosis not present

## 2017-12-28 DIAGNOSIS — I5030 Unspecified diastolic (congestive) heart failure: Secondary | ICD-10-CM | POA: Diagnosis not present

## 2017-12-28 DIAGNOSIS — K219 Gastro-esophageal reflux disease without esophagitis: Secondary | ICD-10-CM | POA: Diagnosis not present

## 2017-12-28 DIAGNOSIS — E039 Hypothyroidism, unspecified: Secondary | ICD-10-CM | POA: Diagnosis not present

## 2017-12-28 DIAGNOSIS — M25562 Pain in left knee: Secondary | ICD-10-CM | POA: Diagnosis not present

## 2018-01-01 DIAGNOSIS — I4891 Unspecified atrial fibrillation: Secondary | ICD-10-CM | POA: Diagnosis not present

## 2018-01-01 DIAGNOSIS — I110 Hypertensive heart disease with heart failure: Secondary | ICD-10-CM | POA: Diagnosis not present

## 2018-01-01 DIAGNOSIS — I509 Heart failure, unspecified: Secondary | ICD-10-CM | POA: Diagnosis not present

## 2018-01-01 DIAGNOSIS — C50911 Malignant neoplasm of unspecified site of right female breast: Secondary | ICD-10-CM | POA: Diagnosis not present

## 2018-01-09 DIAGNOSIS — Z79899 Other long term (current) drug therapy: Secondary | ICD-10-CM | POA: Diagnosis not present

## 2018-02-02 DIAGNOSIS — I110 Hypertensive heart disease with heart failure: Secondary | ICD-10-CM | POA: Diagnosis not present

## 2018-02-02 DIAGNOSIS — I509 Heart failure, unspecified: Secondary | ICD-10-CM | POA: Diagnosis not present

## 2018-02-02 DIAGNOSIS — C50911 Malignant neoplasm of unspecified site of right female breast: Secondary | ICD-10-CM | POA: Diagnosis not present

## 2018-02-02 DIAGNOSIS — I4891 Unspecified atrial fibrillation: Secondary | ICD-10-CM | POA: Diagnosis not present

## 2018-02-22 DIAGNOSIS — R1311 Dysphagia, oral phase: Secondary | ICD-10-CM | POA: Diagnosis not present

## 2018-02-22 DIAGNOSIS — I4891 Unspecified atrial fibrillation: Secondary | ICD-10-CM | POA: Diagnosis not present

## 2018-02-22 DIAGNOSIS — R488 Other symbolic dysfunctions: Secondary | ICD-10-CM | POA: Diagnosis not present

## 2018-02-22 DIAGNOSIS — R5383 Other fatigue: Secondary | ICD-10-CM | POA: Diagnosis not present

## 2018-02-22 DIAGNOSIS — R1319 Other dysphagia: Secondary | ICD-10-CM | POA: Diagnosis not present

## 2018-02-22 DIAGNOSIS — M6281 Muscle weakness (generalized): Secondary | ICD-10-CM | POA: Diagnosis not present

## 2018-02-22 DIAGNOSIS — R262 Difficulty in walking, not elsewhere classified: Secondary | ICD-10-CM | POA: Diagnosis not present

## 2018-02-22 DIAGNOSIS — M25562 Pain in left knee: Secondary | ICD-10-CM | POA: Diagnosis not present

## 2018-02-22 DIAGNOSIS — I10 Essential (primary) hypertension: Secondary | ICD-10-CM | POA: Diagnosis not present

## 2018-02-22 DIAGNOSIS — E039 Hypothyroidism, unspecified: Secondary | ICD-10-CM | POA: Diagnosis not present

## 2018-02-22 DIAGNOSIS — R2681 Unsteadiness on feet: Secondary | ICD-10-CM | POA: Diagnosis not present

## 2018-02-22 DIAGNOSIS — K219 Gastro-esophageal reflux disease without esophagitis: Secondary | ICD-10-CM | POA: Diagnosis not present

## 2018-02-22 DIAGNOSIS — E46 Unspecified protein-calorie malnutrition: Secondary | ICD-10-CM | POA: Diagnosis not present

## 2018-02-22 DIAGNOSIS — R2689 Other abnormalities of gait and mobility: Secondary | ICD-10-CM | POA: Diagnosis not present

## 2018-02-22 DIAGNOSIS — R278 Other lack of coordination: Secondary | ICD-10-CM | POA: Diagnosis not present

## 2018-02-22 DIAGNOSIS — I5030 Unspecified diastolic (congestive) heart failure: Secondary | ICD-10-CM | POA: Diagnosis not present

## 2018-02-23 DIAGNOSIS — K219 Gastro-esophageal reflux disease without esophagitis: Secondary | ICD-10-CM | POA: Diagnosis not present

## 2018-02-23 DIAGNOSIS — M25562 Pain in left knee: Secondary | ICD-10-CM | POA: Diagnosis not present

## 2018-02-23 DIAGNOSIS — R2689 Other abnormalities of gait and mobility: Secondary | ICD-10-CM | POA: Diagnosis not present

## 2018-02-23 DIAGNOSIS — M6281 Muscle weakness (generalized): Secondary | ICD-10-CM | POA: Diagnosis not present

## 2018-02-23 DIAGNOSIS — R488 Other symbolic dysfunctions: Secondary | ICD-10-CM | POA: Diagnosis not present

## 2018-02-23 DIAGNOSIS — I10 Essential (primary) hypertension: Secondary | ICD-10-CM | POA: Diagnosis not present

## 2018-02-23 DIAGNOSIS — R2681 Unsteadiness on feet: Secondary | ICD-10-CM | POA: Diagnosis not present

## 2018-02-23 DIAGNOSIS — R5383 Other fatigue: Secondary | ICD-10-CM | POA: Diagnosis not present

## 2018-02-23 DIAGNOSIS — E46 Unspecified protein-calorie malnutrition: Secondary | ICD-10-CM | POA: Diagnosis not present

## 2018-02-23 DIAGNOSIS — I4891 Unspecified atrial fibrillation: Secondary | ICD-10-CM | POA: Diagnosis not present

## 2018-02-23 DIAGNOSIS — R278 Other lack of coordination: Secondary | ICD-10-CM | POA: Diagnosis not present

## 2018-02-23 DIAGNOSIS — R1311 Dysphagia, oral phase: Secondary | ICD-10-CM | POA: Diagnosis not present

## 2018-02-23 DIAGNOSIS — E039 Hypothyroidism, unspecified: Secondary | ICD-10-CM | POA: Diagnosis not present

## 2018-02-23 DIAGNOSIS — I5030 Unspecified diastolic (congestive) heart failure: Secondary | ICD-10-CM | POA: Diagnosis not present

## 2018-02-23 DIAGNOSIS — R1319 Other dysphagia: Secondary | ICD-10-CM | POA: Diagnosis not present

## 2018-02-23 DIAGNOSIS — R262 Difficulty in walking, not elsewhere classified: Secondary | ICD-10-CM | POA: Diagnosis not present

## 2018-02-26 DIAGNOSIS — I10 Essential (primary) hypertension: Secondary | ICD-10-CM | POA: Diagnosis not present

## 2018-02-26 DIAGNOSIS — R488 Other symbolic dysfunctions: Secondary | ICD-10-CM | POA: Diagnosis not present

## 2018-02-26 DIAGNOSIS — R2689 Other abnormalities of gait and mobility: Secondary | ICD-10-CM | POA: Diagnosis not present

## 2018-02-26 DIAGNOSIS — R5383 Other fatigue: Secondary | ICD-10-CM | POA: Diagnosis not present

## 2018-02-26 DIAGNOSIS — R2681 Unsteadiness on feet: Secondary | ICD-10-CM | POA: Diagnosis not present

## 2018-02-26 DIAGNOSIS — R1311 Dysphagia, oral phase: Secondary | ICD-10-CM | POA: Diagnosis not present

## 2018-02-26 DIAGNOSIS — E46 Unspecified protein-calorie malnutrition: Secondary | ICD-10-CM | POA: Diagnosis not present

## 2018-02-26 DIAGNOSIS — R262 Difficulty in walking, not elsewhere classified: Secondary | ICD-10-CM | POA: Diagnosis not present

## 2018-02-26 DIAGNOSIS — K219 Gastro-esophageal reflux disease without esophagitis: Secondary | ICD-10-CM | POA: Diagnosis not present

## 2018-02-26 DIAGNOSIS — I5030 Unspecified diastolic (congestive) heart failure: Secondary | ICD-10-CM | POA: Diagnosis not present

## 2018-02-26 DIAGNOSIS — E039 Hypothyroidism, unspecified: Secondary | ICD-10-CM | POA: Diagnosis not present

## 2018-02-26 DIAGNOSIS — R278 Other lack of coordination: Secondary | ICD-10-CM | POA: Diagnosis not present

## 2018-02-26 DIAGNOSIS — M6281 Muscle weakness (generalized): Secondary | ICD-10-CM | POA: Diagnosis not present

## 2018-02-26 DIAGNOSIS — M25562 Pain in left knee: Secondary | ICD-10-CM | POA: Diagnosis not present

## 2018-02-26 DIAGNOSIS — R1319 Other dysphagia: Secondary | ICD-10-CM | POA: Diagnosis not present

## 2018-02-26 DIAGNOSIS — I4891 Unspecified atrial fibrillation: Secondary | ICD-10-CM | POA: Diagnosis not present

## 2018-02-27 DIAGNOSIS — R278 Other lack of coordination: Secondary | ICD-10-CM | POA: Diagnosis not present

## 2018-02-27 DIAGNOSIS — R5383 Other fatigue: Secondary | ICD-10-CM | POA: Diagnosis not present

## 2018-02-27 DIAGNOSIS — I4891 Unspecified atrial fibrillation: Secondary | ICD-10-CM | POA: Diagnosis not present

## 2018-02-27 DIAGNOSIS — E46 Unspecified protein-calorie malnutrition: Secondary | ICD-10-CM | POA: Diagnosis not present

## 2018-02-27 DIAGNOSIS — R1311 Dysphagia, oral phase: Secondary | ICD-10-CM | POA: Diagnosis not present

## 2018-02-27 DIAGNOSIS — M6281 Muscle weakness (generalized): Secondary | ICD-10-CM | POA: Diagnosis not present

## 2018-02-27 DIAGNOSIS — R488 Other symbolic dysfunctions: Secondary | ICD-10-CM | POA: Diagnosis not present

## 2018-02-27 DIAGNOSIS — R262 Difficulty in walking, not elsewhere classified: Secondary | ICD-10-CM | POA: Diagnosis not present

## 2018-02-27 DIAGNOSIS — I10 Essential (primary) hypertension: Secondary | ICD-10-CM | POA: Diagnosis not present

## 2018-02-27 DIAGNOSIS — I5030 Unspecified diastolic (congestive) heart failure: Secondary | ICD-10-CM | POA: Diagnosis not present

## 2018-02-27 DIAGNOSIS — R2689 Other abnormalities of gait and mobility: Secondary | ICD-10-CM | POA: Diagnosis not present

## 2018-02-27 DIAGNOSIS — E039 Hypothyroidism, unspecified: Secondary | ICD-10-CM | POA: Diagnosis not present

## 2018-02-27 DIAGNOSIS — R2681 Unsteadiness on feet: Secondary | ICD-10-CM | POA: Diagnosis not present

## 2018-02-27 DIAGNOSIS — K219 Gastro-esophageal reflux disease without esophagitis: Secondary | ICD-10-CM | POA: Diagnosis not present

## 2018-02-27 DIAGNOSIS — R1319 Other dysphagia: Secondary | ICD-10-CM | POA: Diagnosis not present

## 2018-02-27 DIAGNOSIS — M25562 Pain in left knee: Secondary | ICD-10-CM | POA: Diagnosis not present

## 2018-02-28 DIAGNOSIS — R1311 Dysphagia, oral phase: Secondary | ICD-10-CM | POA: Diagnosis not present

## 2018-02-28 DIAGNOSIS — I10 Essential (primary) hypertension: Secondary | ICD-10-CM | POA: Diagnosis not present

## 2018-02-28 DIAGNOSIS — M6281 Muscle weakness (generalized): Secondary | ICD-10-CM | POA: Diagnosis not present

## 2018-02-28 DIAGNOSIS — I4891 Unspecified atrial fibrillation: Secondary | ICD-10-CM | POA: Diagnosis not present

## 2018-02-28 DIAGNOSIS — R262 Difficulty in walking, not elsewhere classified: Secondary | ICD-10-CM | POA: Diagnosis not present

## 2018-02-28 DIAGNOSIS — K219 Gastro-esophageal reflux disease without esophagitis: Secondary | ICD-10-CM | POA: Diagnosis not present

## 2018-02-28 DIAGNOSIS — I5030 Unspecified diastolic (congestive) heart failure: Secondary | ICD-10-CM | POA: Diagnosis not present

## 2018-02-28 DIAGNOSIS — E46 Unspecified protein-calorie malnutrition: Secondary | ICD-10-CM | POA: Diagnosis not present

## 2018-02-28 DIAGNOSIS — E039 Hypothyroidism, unspecified: Secondary | ICD-10-CM | POA: Diagnosis not present

## 2018-02-28 DIAGNOSIS — R488 Other symbolic dysfunctions: Secondary | ICD-10-CM | POA: Diagnosis not present

## 2018-02-28 DIAGNOSIS — M25562 Pain in left knee: Secondary | ICD-10-CM | POA: Diagnosis not present

## 2018-02-28 DIAGNOSIS — R278 Other lack of coordination: Secondary | ICD-10-CM | POA: Diagnosis not present

## 2018-02-28 DIAGNOSIS — R1319 Other dysphagia: Secondary | ICD-10-CM | POA: Diagnosis not present

## 2018-02-28 DIAGNOSIS — R5383 Other fatigue: Secondary | ICD-10-CM | POA: Diagnosis not present

## 2018-02-28 DIAGNOSIS — R2689 Other abnormalities of gait and mobility: Secondary | ICD-10-CM | POA: Diagnosis not present

## 2018-02-28 DIAGNOSIS — R2681 Unsteadiness on feet: Secondary | ICD-10-CM | POA: Diagnosis not present

## 2018-03-01 DIAGNOSIS — E039 Hypothyroidism, unspecified: Secondary | ICD-10-CM | POA: Diagnosis not present

## 2018-03-01 DIAGNOSIS — K219 Gastro-esophageal reflux disease without esophagitis: Secondary | ICD-10-CM | POA: Diagnosis not present

## 2018-03-01 DIAGNOSIS — R1311 Dysphagia, oral phase: Secondary | ICD-10-CM | POA: Diagnosis not present

## 2018-03-01 DIAGNOSIS — R5383 Other fatigue: Secondary | ICD-10-CM | POA: Diagnosis not present

## 2018-03-01 DIAGNOSIS — R488 Other symbolic dysfunctions: Secondary | ICD-10-CM | POA: Diagnosis not present

## 2018-03-01 DIAGNOSIS — R278 Other lack of coordination: Secondary | ICD-10-CM | POA: Diagnosis not present

## 2018-03-01 DIAGNOSIS — R1319 Other dysphagia: Secondary | ICD-10-CM | POA: Diagnosis not present

## 2018-03-01 DIAGNOSIS — I5030 Unspecified diastolic (congestive) heart failure: Secondary | ICD-10-CM | POA: Diagnosis not present

## 2018-03-01 DIAGNOSIS — R2689 Other abnormalities of gait and mobility: Secondary | ICD-10-CM | POA: Diagnosis not present

## 2018-03-01 DIAGNOSIS — E46 Unspecified protein-calorie malnutrition: Secondary | ICD-10-CM | POA: Diagnosis not present

## 2018-03-01 DIAGNOSIS — I4891 Unspecified atrial fibrillation: Secondary | ICD-10-CM | POA: Diagnosis not present

## 2018-03-01 DIAGNOSIS — R262 Difficulty in walking, not elsewhere classified: Secondary | ICD-10-CM | POA: Diagnosis not present

## 2018-03-01 DIAGNOSIS — M25562 Pain in left knee: Secondary | ICD-10-CM | POA: Diagnosis not present

## 2018-03-01 DIAGNOSIS — I10 Essential (primary) hypertension: Secondary | ICD-10-CM | POA: Diagnosis not present

## 2018-03-01 DIAGNOSIS — R2681 Unsteadiness on feet: Secondary | ICD-10-CM | POA: Diagnosis not present

## 2018-03-01 DIAGNOSIS — M6281 Muscle weakness (generalized): Secondary | ICD-10-CM | POA: Diagnosis not present

## 2018-03-02 DIAGNOSIS — R1311 Dysphagia, oral phase: Secondary | ICD-10-CM | POA: Diagnosis not present

## 2018-03-02 DIAGNOSIS — M6281 Muscle weakness (generalized): Secondary | ICD-10-CM | POA: Diagnosis not present

## 2018-03-02 DIAGNOSIS — I10 Essential (primary) hypertension: Secondary | ICD-10-CM | POA: Diagnosis not present

## 2018-03-02 DIAGNOSIS — K219 Gastro-esophageal reflux disease without esophagitis: Secondary | ICD-10-CM | POA: Diagnosis not present

## 2018-03-02 DIAGNOSIS — R1319 Other dysphagia: Secondary | ICD-10-CM | POA: Diagnosis not present

## 2018-03-02 DIAGNOSIS — E039 Hypothyroidism, unspecified: Secondary | ICD-10-CM | POA: Diagnosis not present

## 2018-03-02 DIAGNOSIS — R5383 Other fatigue: Secondary | ICD-10-CM | POA: Diagnosis not present

## 2018-03-02 DIAGNOSIS — E46 Unspecified protein-calorie malnutrition: Secondary | ICD-10-CM | POA: Diagnosis not present

## 2018-03-02 DIAGNOSIS — R488 Other symbolic dysfunctions: Secondary | ICD-10-CM | POA: Diagnosis not present

## 2018-03-02 DIAGNOSIS — M25562 Pain in left knee: Secondary | ICD-10-CM | POA: Diagnosis not present

## 2018-03-02 DIAGNOSIS — R262 Difficulty in walking, not elsewhere classified: Secondary | ICD-10-CM | POA: Diagnosis not present

## 2018-03-02 DIAGNOSIS — I5030 Unspecified diastolic (congestive) heart failure: Secondary | ICD-10-CM | POA: Diagnosis not present

## 2018-03-02 DIAGNOSIS — R278 Other lack of coordination: Secondary | ICD-10-CM | POA: Diagnosis not present

## 2018-03-02 DIAGNOSIS — I4891 Unspecified atrial fibrillation: Secondary | ICD-10-CM | POA: Diagnosis not present

## 2018-03-02 DIAGNOSIS — R2689 Other abnormalities of gait and mobility: Secondary | ICD-10-CM | POA: Diagnosis not present

## 2018-03-02 DIAGNOSIS — R2681 Unsteadiness on feet: Secondary | ICD-10-CM | POA: Diagnosis not present

## 2018-03-05 DIAGNOSIS — E039 Hypothyroidism, unspecified: Secondary | ICD-10-CM | POA: Diagnosis not present

## 2018-03-05 DIAGNOSIS — M25562 Pain in left knee: Secondary | ICD-10-CM | POA: Diagnosis not present

## 2018-03-05 DIAGNOSIS — R488 Other symbolic dysfunctions: Secondary | ICD-10-CM | POA: Diagnosis not present

## 2018-03-05 DIAGNOSIS — R1311 Dysphagia, oral phase: Secondary | ICD-10-CM | POA: Diagnosis not present

## 2018-03-05 DIAGNOSIS — R278 Other lack of coordination: Secondary | ICD-10-CM | POA: Diagnosis not present

## 2018-03-05 DIAGNOSIS — I509 Heart failure, unspecified: Secondary | ICD-10-CM | POA: Diagnosis not present

## 2018-03-05 DIAGNOSIS — E46 Unspecified protein-calorie malnutrition: Secondary | ICD-10-CM | POA: Diagnosis not present

## 2018-03-05 DIAGNOSIS — R5383 Other fatigue: Secondary | ICD-10-CM | POA: Diagnosis not present

## 2018-03-05 DIAGNOSIS — I4891 Unspecified atrial fibrillation: Secondary | ICD-10-CM | POA: Diagnosis not present

## 2018-03-05 DIAGNOSIS — R1319 Other dysphagia: Secondary | ICD-10-CM | POA: Diagnosis not present

## 2018-03-05 DIAGNOSIS — K219 Gastro-esophageal reflux disease without esophagitis: Secondary | ICD-10-CM | POA: Diagnosis not present

## 2018-03-05 DIAGNOSIS — R2681 Unsteadiness on feet: Secondary | ICD-10-CM | POA: Diagnosis not present

## 2018-03-05 DIAGNOSIS — I5030 Unspecified diastolic (congestive) heart failure: Secondary | ICD-10-CM | POA: Diagnosis not present

## 2018-03-05 DIAGNOSIS — R262 Difficulty in walking, not elsewhere classified: Secondary | ICD-10-CM | POA: Diagnosis not present

## 2018-03-05 DIAGNOSIS — I110 Hypertensive heart disease with heart failure: Secondary | ICD-10-CM | POA: Diagnosis not present

## 2018-03-05 DIAGNOSIS — M6281 Muscle weakness (generalized): Secondary | ICD-10-CM | POA: Diagnosis not present

## 2018-03-05 DIAGNOSIS — C50911 Malignant neoplasm of unspecified site of right female breast: Secondary | ICD-10-CM | POA: Diagnosis not present

## 2018-03-05 DIAGNOSIS — I10 Essential (primary) hypertension: Secondary | ICD-10-CM | POA: Diagnosis not present

## 2018-03-05 DIAGNOSIS — R2689 Other abnormalities of gait and mobility: Secondary | ICD-10-CM | POA: Diagnosis not present

## 2018-03-06 ENCOUNTER — Emergency Department (HOSPITAL_COMMUNITY): Payer: Medicare Other

## 2018-03-06 ENCOUNTER — Inpatient Hospital Stay (HOSPITAL_COMMUNITY)
Admission: EM | Admit: 2018-03-06 | Discharge: 2018-03-11 | DRG: 872 | Disposition: A | Discharge status: Skilled Nursing Facility | Payer: Medicare Other | Attending: Cardiovascular Disease | Admitting: Cardiovascular Disease

## 2018-03-06 DIAGNOSIS — I4510 Unspecified right bundle-branch block: Secondary | ICD-10-CM | POA: Diagnosis not present

## 2018-03-06 DIAGNOSIS — R55 Syncope and collapse: Secondary | ICD-10-CM | POA: Diagnosis not present

## 2018-03-06 DIAGNOSIS — I10 Essential (primary) hypertension: Secondary | ICD-10-CM | POA: Diagnosis not present

## 2018-03-06 DIAGNOSIS — N3001 Acute cystitis with hematuria: Secondary | ICD-10-CM | POA: Diagnosis not present

## 2018-03-06 DIAGNOSIS — N179 Acute kidney failure, unspecified: Secondary | ICD-10-CM

## 2018-03-06 DIAGNOSIS — E861 Hypovolemia: Secondary | ICD-10-CM | POA: Diagnosis not present

## 2018-03-06 DIAGNOSIS — R5383 Other fatigue: Secondary | ICD-10-CM | POA: Diagnosis not present

## 2018-03-06 DIAGNOSIS — R488 Other symbolic dysfunctions: Secondary | ICD-10-CM | POA: Diagnosis not present

## 2018-03-06 DIAGNOSIS — I340 Nonrheumatic mitral (valve) insufficiency: Secondary | ICD-10-CM | POA: Diagnosis not present

## 2018-03-06 DIAGNOSIS — R6520 Severe sepsis without septic shock: Secondary | ICD-10-CM | POA: Diagnosis present

## 2018-03-06 DIAGNOSIS — Z515 Encounter for palliative care: Secondary | ICD-10-CM | POA: Diagnosis not present

## 2018-03-06 DIAGNOSIS — I5032 Chronic diastolic (congestive) heart failure: Secondary | ICD-10-CM | POA: Diagnosis not present

## 2018-03-06 DIAGNOSIS — C50911 Malignant neoplasm of unspecified site of right female breast: Secondary | ICD-10-CM | POA: Diagnosis not present

## 2018-03-06 DIAGNOSIS — R1319 Other dysphagia: Secondary | ICD-10-CM | POA: Diagnosis not present

## 2018-03-06 DIAGNOSIS — F0390 Unspecified dementia without behavioral disturbance: Secondary | ICD-10-CM

## 2018-03-06 DIAGNOSIS — M6281 Muscle weakness (generalized): Secondary | ICD-10-CM | POA: Diagnosis not present

## 2018-03-06 DIAGNOSIS — L8990 Pressure ulcer of unspecified site, unspecified stage: Secondary | ICD-10-CM

## 2018-03-06 DIAGNOSIS — I130 Hypertensive heart and chronic kidney disease with heart failure and stage 1 through stage 4 chronic kidney disease, or unspecified chronic kidney disease: Secondary | ICD-10-CM | POA: Diagnosis present

## 2018-03-06 DIAGNOSIS — R1311 Dysphagia, oral phase: Secondary | ICD-10-CM | POA: Diagnosis not present

## 2018-03-06 DIAGNOSIS — R079 Chest pain, unspecified: Secondary | ICD-10-CM | POA: Diagnosis not present

## 2018-03-06 DIAGNOSIS — T460X1A Poisoning by cardiac-stimulant glycosides and drugs of similar action, accidental (unintentional), initial encounter: Secondary | ICD-10-CM | POA: Diagnosis not present

## 2018-03-06 DIAGNOSIS — M25562 Pain in left knee: Secondary | ICD-10-CM | POA: Diagnosis not present

## 2018-03-06 DIAGNOSIS — N183 Chronic kidney disease, stage 3 (moderate): Secondary | ICD-10-CM | POA: Diagnosis not present

## 2018-03-06 DIAGNOSIS — I4891 Unspecified atrial fibrillation: Secondary | ICD-10-CM | POA: Diagnosis not present

## 2018-03-06 DIAGNOSIS — N390 Urinary tract infection, site not specified: Secondary | ICD-10-CM | POA: Diagnosis not present

## 2018-03-06 DIAGNOSIS — Z7189 Other specified counseling: Secondary | ICD-10-CM | POA: Diagnosis not present

## 2018-03-06 DIAGNOSIS — K219 Gastro-esophageal reflux disease without esophagitis: Secondary | ICD-10-CM | POA: Diagnosis not present

## 2018-03-06 DIAGNOSIS — T50905A Adverse effect of unspecified drugs, medicaments and biological substances, initial encounter: Secondary | ICD-10-CM | POA: Diagnosis not present

## 2018-03-06 DIAGNOSIS — R001 Bradycardia, unspecified: Secondary | ICD-10-CM | POA: Diagnosis not present

## 2018-03-06 DIAGNOSIS — I499 Cardiac arrhythmia, unspecified: Secondary | ICD-10-CM | POA: Diagnosis not present

## 2018-03-06 DIAGNOSIS — I480 Paroxysmal atrial fibrillation: Secondary | ICD-10-CM | POA: Diagnosis not present

## 2018-03-06 DIAGNOSIS — I214 Non-ST elevation (NSTEMI) myocardial infarction: Secondary | ICD-10-CM | POA: Diagnosis not present

## 2018-03-06 DIAGNOSIS — A419 Sepsis, unspecified organism: Principal | ICD-10-CM | POA: Diagnosis present

## 2018-03-06 DIAGNOSIS — R2681 Unsteadiness on feet: Secondary | ICD-10-CM | POA: Diagnosis not present

## 2018-03-06 DIAGNOSIS — Z79899 Other long term (current) drug therapy: Secondary | ICD-10-CM | POA: Diagnosis not present

## 2018-03-06 DIAGNOSIS — E039 Hypothyroidism, unspecified: Secondary | ICD-10-CM | POA: Diagnosis not present

## 2018-03-06 DIAGNOSIS — E872 Acidosis: Secondary | ICD-10-CM | POA: Diagnosis not present

## 2018-03-06 DIAGNOSIS — I350 Nonrheumatic aortic (valve) stenosis: Secondary | ICD-10-CM

## 2018-03-06 DIAGNOSIS — T460X5A Adverse effect of cardiac-stimulant glycosides and drugs of similar action, initial encounter: Secondary | ICD-10-CM | POA: Diagnosis present

## 2018-03-06 DIAGNOSIS — Z66 Do not resuscitate: Secondary | ICD-10-CM | POA: Diagnosis not present

## 2018-03-06 DIAGNOSIS — R278 Other lack of coordination: Secondary | ICD-10-CM | POA: Diagnosis not present

## 2018-03-06 DIAGNOSIS — I5030 Unspecified diastolic (congestive) heart failure: Secondary | ICD-10-CM | POA: Diagnosis not present

## 2018-03-06 DIAGNOSIS — I959 Hypotension, unspecified: Secondary | ICD-10-CM | POA: Diagnosis not present

## 2018-03-06 DIAGNOSIS — Y92129 Unspecified place in nursing home as the place of occurrence of the external cause: Secondary | ICD-10-CM

## 2018-03-06 DIAGNOSIS — R2689 Other abnormalities of gait and mobility: Secondary | ICD-10-CM | POA: Diagnosis not present

## 2018-03-06 DIAGNOSIS — R570 Cardiogenic shock: Secondary | ICD-10-CM | POA: Diagnosis not present

## 2018-03-06 DIAGNOSIS — E46 Unspecified protein-calorie malnutrition: Secondary | ICD-10-CM | POA: Diagnosis not present

## 2018-03-06 DIAGNOSIS — R262 Difficulty in walking, not elsewhere classified: Secondary | ICD-10-CM | POA: Diagnosis not present

## 2018-03-06 DIAGNOSIS — R000 Tachycardia, unspecified: Secondary | ICD-10-CM | POA: Diagnosis not present

## 2018-03-06 HISTORY — DX: Heart failure, unspecified: I50.9

## 2018-03-06 HISTORY — DX: Nonrheumatic aortic (valve) stenosis: I35.0

## 2018-03-06 HISTORY — DX: Unspecified atrial fibrillation: I48.91

## 2018-03-06 LAB — CBC WITH DIFFERENTIAL/PLATELET
Basophils Absolute: 0 10*3/uL (ref 0.0–0.1)
Basophils Relative: 0 %
Eosinophils Absolute: 0 10*3/uL (ref 0.0–0.7)
Eosinophils Relative: 0 %
HCT: 41 % (ref 36.0–46.0)
Hemoglobin: 13.5 g/dL (ref 12.0–15.0)
Lymphocytes Relative: 35 %
Lymphs Abs: 4.6 10*3/uL — ABNORMAL HIGH (ref 0.7–4.0)
MCH: 31.7 pg (ref 26.0–34.0)
MCHC: 32.9 g/dL (ref 30.0–36.0)
MCV: 96.2 fL (ref 78.0–100.0)
Monocytes Absolute: 0.8 10*3/uL (ref 0.1–1.0)
Monocytes Relative: 6 %
Neutro Abs: 7.8 10*3/uL — ABNORMAL HIGH (ref 1.7–7.7)
Neutrophils Relative %: 59 %
Platelets: 135 10*3/uL — ABNORMAL LOW (ref 150–400)
RBC: 4.26 MIL/uL (ref 3.87–5.11)
RDW: 15 % (ref 11.5–15.5)
WBC: 13.2 10*3/uL — ABNORMAL HIGH (ref 4.0–10.5)

## 2018-03-06 LAB — COMPREHENSIVE METABOLIC PANEL
ALT: 25 U/L (ref 14–54)
AST: 62 U/L — ABNORMAL HIGH (ref 15–41)
Albumin: 3 g/dL — ABNORMAL LOW (ref 3.5–5.0)
Alkaline Phosphatase: 80 U/L (ref 38–126)
Anion gap: 11 (ref 5–15)
BUN: 48 mg/dL — ABNORMAL HIGH (ref 6–20)
CO2: 22 mmol/L (ref 22–32)
Calcium: 9.3 mg/dL (ref 8.9–10.3)
Chloride: 102 mmol/L (ref 101–111)
Creatinine, Ser: 2.37 mg/dL — ABNORMAL HIGH (ref 0.44–1.00)
GFR calc Af Amer: 20 mL/min — ABNORMAL LOW (ref >60)
GFR calc non Af Amer: 18 mL/min — ABNORMAL LOW (ref >60)
Glucose, Bld: 155 mg/dL — ABNORMAL HIGH (ref 65–99)
Potassium: 4.9 mmol/L (ref 3.5–5.1)
Sodium: 135 mmol/L (ref 135–145)
Total Bilirubin: 0.7 mg/dL (ref 0.3–1.2)
Total Protein: 6.4 g/dL — ABNORMAL LOW (ref 6.5–8.1)

## 2018-03-06 LAB — I-STAT TROPONIN, ED: Troponin i, poc: 0.3 ng/mL (ref 0.00–0.08)

## 2018-03-06 LAB — I-STAT CHEM 8, ED
BUN: 46 mg/dL — ABNORMAL HIGH (ref 6–20)
Calcium, Ion: 1.16 mmol/L (ref 1.15–1.40)
Chloride: 102 mmol/L (ref 101–111)
Creatinine, Ser: 2.4 mg/dL — ABNORMAL HIGH (ref 0.44–1.00)
Glucose, Bld: 149 mg/dL — ABNORMAL HIGH (ref 65–99)
HCT: 41 % (ref 36.0–46.0)
Hemoglobin: 13.9 g/dL (ref 12.0–15.0)
Potassium: 5 mmol/L (ref 3.5–5.1)
Sodium: 138 mmol/L (ref 135–145)
TCO2: 23 mmol/L (ref 22–32)

## 2018-03-06 LAB — DIGOXIN LEVEL: Digoxin Level: 2.4 ng/mL — ABNORMAL HIGH (ref 0.8–2.0)

## 2018-03-06 MED ORDER — SODIUM CHLORIDE 0.9 % IV BOLUS
1000.0000 mL | Freq: Once | INTRAVENOUS | Status: AC
  Administered 2018-03-06: 1000 mL via INTRAVENOUS

## 2018-03-06 MED ORDER — SODIUM CHLORIDE 0.9 % IV SOLN
2.0000 | Freq: Once | INTRAVENOUS | Status: AC
  Administered 2018-03-06: 2 via INTRAVENOUS
  Filled 2018-03-06: qty 80

## 2018-03-06 MED ORDER — DOPAMINE-DEXTROSE 3.2-5 MG/ML-% IV SOLN
0.0000 ug/kg/min | Freq: Once | INTRAVENOUS | Status: AC
  Administered 2018-03-07: 5 ug/kg/min via INTRAVENOUS
  Filled 2018-03-06: qty 250

## 2018-03-06 MED ORDER — ATROPINE SULFATE 1 MG/10ML IJ SOSY
0.5000 mg | PREFILLED_SYRINGE | Freq: Once | INTRAMUSCULAR | Status: AC
  Administered 2018-03-06: 0.5 mg via INTRAVENOUS
  Filled 2018-03-06: qty 10

## 2018-03-06 MED ORDER — EPINEPHRINE PF 1 MG/ML IJ SOLN
0.5000 ug/min | INTRAMUSCULAR | Status: DC
  Administered 2018-03-06: 1 ug/min via INTRAVENOUS
  Filled 2018-03-06: qty 4

## 2018-03-06 NOTE — ED Provider Notes (Signed)
[facility] EMERGENCY DEPARTMENT Provider Note   CSN: [id number] Arrival date & time: 03/06/18  1815     History   Chief Complaint Chief Complaint  Patient presents with  . Bradycardia    HPI Jo Terry is a 84 y.o. female.  The history is provided by medical records, the EMS personnel and a relative. No language interpreter was used.   Jo Terry is a 84 y.o. female who presents to the Emergency Department complaining of bradycardia.  Level V caveat due to dementia. History is provided by the patient's sister, EMS, nursing facility. Per EMS reports the patient was unresponsive around noon today. When she was rechecked a few hours later an EKG was performed and she was noted to be bradycardic. It sounds like episodes of unresponsiveness are intermittent. Per EMS family initially declined EMS transport but then agreed. Patient denies any symptoms on ED arrival. She is unsure where she is or what occurred earlier today. Per family patient is DNR and would not want invasive measures. She presents to the emergency department paced by EMS. Past Medical History:  Diagnosis Date  . Atrial fibrillation ([place])   . Congestive heart failure (CHF) ([place])    she has a history of breast cancer, dementia  Patient Active Problem List   Diagnosis Date Noted  . Bradycardia 03/07/2018    The histories are not reviewed yet. Please review them in the "History" navigator section and refresh this Kathleen.   OB History   None      Home Medications    Prior to Admission medications   Medication Sig Start Date End Date Taking? Authorizing Provider  allopurinol (ZYLOPRIM) 100 MG tablet Take 100 mg by mouth 2 (two) times daily.   Yes [provider]  aspirin 81 MG chewable tablet Chew 81 mg by mouth daily.   Yes [provider]  digoxin (LANOXIN) 0.25 MG tablet Take 0.25 mg by mouth daily.   Yes [provider]  donepezil (ARICEPT) 10 MG tablet Take  10 mg by mouth at bedtime.   Yes [provider]  furosemide (LASIX) 20 MG tablet Take 20 mg by mouth 2 (two) times daily.   Yes [provider]  levothyroxine (SYNTHROID, LEVOTHROID) 25 MCG tablet Take 25 mcg by mouth daily before breakfast.   Yes [provider]  loratadine (CLARITIN) 10 MG tablet Take 10 mg by mouth daily.   Yes [provider]  memantine (NAMENDA XR) 28 MG CP24 24 hr capsule Take 28 mg by mouth daily.   Yes [provider]  metoprolol tartrate (LOPRESSOR) 50 MG tablet Take 50 mg by mouth 2 (two) times daily.   Yes [provider]  ofloxacin (OCUFLOX) 0.3 % ophthalmic solution Place 1 drop into both eyes every 4 (four) hours. [id number]   Yes [provider]  potassium chloride SA (K-DUR,KLOR-CON) 20 MEQ tablet Take 20 mEq by mouth 2 (two) times daily.   Yes [provider]  Selenium Sulfide 2.25 % SHAM Apply topically See admin instructions. Apply to scalp at night, leave on overnight before shower days, then rinse   Yes [provider]    Family History No family history on file.  Social History Social History   Tobacco Use  . Smoking status: Not on file  Substance Use Topics  . Alcohol use: Not on file  . Drug use: Not on file     Allergies   Patient has no known allergies.  Review of Systems Review of Systems  Unable to perform ROS: Dementia     Physical Exam Updated Vital Signs BP (!) 86/76   Pulse (!) 34   Temp (!) 96.6 F (35.9 C) (Oral)   Resp 19   Wt 67.5 kg (148 lb 13 oz)   SpO2 100%   Physical Exam  Constitutional: She appears well-developed and well-nourished.  HENT:  Head: Normocephalic and atraumatic.  Cardiovascular:  bradycardic  Pulmonary/Chest: Effort normal and breath sounds normal. No respiratory distress.  Abdominal: Soft. There is no tenderness. There is no rebound and no guarding.  Musculoskeletal: She exhibits no edema or tenderness.    Neurological:  Drowsy but arousable to verbal stimuli. Markedly confused.  4/5 strength in all four extremities.  Skin: Skin is warm and dry.  Psychiatric:  Unable to assess  Nursing note and vitals reviewed.    ED Treatments / Results  Labs (all labs ordered are listed, but only abnormal results are displayed) Labs Reviewed  COMPREHENSIVE METABOLIC PANEL - Abnormal; Notable for the following components:      Result Value   Glucose, Bld 155 (*)    BUN 48 (*)    Creatinine, Ser 2.37 (*)    Total Protein 6.4 (*)    Albumin 3.0 (*)    AST 62 (*)    GFR calc non Af Amer 18 (*)    GFR calc Af Amer 20 (*)    All other components within normal limits  CBC WITH DIFFERENTIAL/PLATELET - Abnormal; Notable for the following components:   WBC 13.2 (*)    Platelets 135 (*)    Neutro Abs 7.8 (*)    Lymphs Abs 4.6 (*)    All other components within normal limits  DIGOXIN LEVEL - Abnormal; Notable for the following components:   Digoxin Level 2.4 (*)    All other components within normal limits  I-STAT CHEM 8, ED - Abnormal; Notable for the following components:   BUN 46 (*)    Creatinine, Ser 2.40 (*)    Glucose, Bld 149 (*)    All other components within normal limits  I-STAT TROPONIN, ED - Abnormal; Notable for the following components:   Troponin i, poc 0.30 (*)    All other components within normal limits  CULTURE, BLOOD (ROUTINE X 2)  CULTURE, BLOOD (ROUTINE X 2)  URINALYSIS, ROUTINE W REFLEX MICROSCOPIC  LACTIC ACID, PLASMA  LACTIC ACID, PLASMA  PROCALCITONIN  TROPONIN I  TROPONIN I  TROPONIN I  CREATININE, URINE, RANDOM  SODIUM, URINE, RANDOM    EKG EKG Interpretation  Date/Time:  Tuesday March 06 2018 18:33:35 EDT Ventricular Rate:  35 PR Interval:    QRS Duration: 160 QT Interval:  484 QTC Calculation: 370 R Axis:   107 Text Interpretation:  Atrial fibrillation RBBB and LPFB Repol abnrm, global ischemia, diffuse leads Confirmed by Quintella Reichert [phone]) on  03/06/2018 8:41:38 PM   Radiology Dg Chest Port 1 View  Result Date: 03/06/2018 CLINICAL DATA:  Chest pain EXAM: PORTABLE CHEST 1 VIEW COMPARISON:  10/21/2013 chest radiograph. FINDINGS: Pacer pads overlie the left chest. Stable cardiomediastinal silhouette with top-normal heart size. No pneumothorax. No pleural effusion. Lungs appear clear, with no acute consolidative airspace disease and no pulmonary edema. IMPRESSION: No active cardiopulmonary disease. Electronically Signed   By: Ilona Sorrel M.D.   On: 03/06/2018 18:49    Procedures Procedures (including critical care time) CRITICAL CARE Performed by: Quintella Reichert   Total critical care time: 68  minutes  Critical care time was exclusive of separately billable procedures and treating other patients.  Critical care was necessary to treat or prevent imminent or life-threatening deterioration.  Critical care was time spent personally by me on the following activities: development of treatment plan with patient and/or surrogate as well as nursing, discussions with consultants, evaluation of patient's response to treatment, examination of patient, obtaining history from patient or surrogate, ordering and performing treatments and interventions, ordering and review of laboratory studies, ordering and review of radiographic studies, pulse oximetry and re-evaluation of patient's condition.  Medications Ordered in ED Medications  EPINEPHrine (ADRENALIN) 4 mg in dextrose 5 % 250 mL (0.016 mg/mL) infusion (0 mcg/min Intravenous Stopped 03/06/18 2103)  atropine 1 MG/10ML injection 0.5 mg (0.5 mg Intravenous Given 03/06/18 1832)  sodium chloride 0.9 % bolus 1,000 mL (0 mLs Intravenous Stopped 03/06/18 1952)  sodium chloride 0.9 % bolus 1,000 mL (0 mLs Intravenous Stopped 03/06/18 2203)  digoxin immune fab (DIGIFAB) 2 vial in sodium chloride 0.9 % 50 mL IVPB (0 vials Intravenous Stopped 03/06/18 2212)  DOPamine (INTROPIN) 800 mg in dextrose 5 % 250 mL (3.2  mg/mL) infusion (5 mcg/kg/min  50 kg Intravenous New Bag/Given 03/07/18 0036)     Initial Impression / Assessment and Plan / ED Course  I have reviewed the triage vital signs and the nursing notes.  Pertinent labs & imaging results that were available during my care of the patient were reviewed by me and considered in my medical decision making (see chart for details).     Patient here by EMS for unresponsive episode with bradycardia. She does have a DNR. She did present to the department in a paced rhythm but did not appear to have capture. She was having pain with the pacing and this was discontinued. Heart rate during the 30s and she is confused but perfused on examination. She was treated with atropine with no change in her symptoms. Epi drip was initiated and she did have improvement in her blood pressure, stable mental status. Patient does not have any records available in our system and outside facility was contacted for information. Labs today demonstrate creatinine of 2.4, concerning for AKI. Only available prior lab is from October 2018, which demonstrates a creatinine of 0.8. Digoxin level is elevated at 2.4 today, outside lab demonstrates level of greater than three. She was treated with IV fluid resuscitation as well as a digi bind for dig toxicity. Her blood pressure improved and the epinephrine drip was weaned off. Cardiology consulted for recommendations. Cardiology recommends medicine admission with cardiology consult. Medicine consulted for admission. Medicine evaluated the patient in the emergency department and she developed recurrent hypotension. She was placed on an dopamine drip and cardiology was re-consulted for ICU admission. Patient and sister updated findings of studies and need for admission for further treatment and they are in agreement with plan.  Final Clinical Impressions(s) / ED Diagnoses   Final diagnoses:  Symptomatic bradycardia  AKI (acute kidney injury) [facility])      ED Discharge Orders    None       Quintella Reichert, MD 03/07/18 [phone]

## 2018-03-06 NOTE — Consult Note (Addendum)
Cardiology Consult    Patient ID: Jo Terry MRN: [id number], DOB/AGE: 82/21/1931   Admit date: 03/06/2018 Date of Consult: 03/06/2018  Primary Physician: No primary care provider on file. Primary Cardiologist: No primary care provider on file. Requesting Provider: Mariea Clonts, md  Patient Profile   This will serve as H&P to the Cardiology service   Jo Terry is a 84 y.o. female with a history of end-stage dementia, hypertension, atrial fibrillation CHF, thyroid disease.  Resents from nursing home where she lives since 2014 with new onset bradycardia.  Per EMS the patient was found unresponsive today around noon in the nursing home.  Heart rate is found to be in the 30s.  It appears that she has intermittent episodes of unresponsiveness over the preceding days.  The patient is accompanied by her sister and brother-in-law.  They can provide minimal to no history.  Patient herself is nonverbal.  Patient denies any pain, shortness of breath, palpitations.  Patient is DNR.  Over 20 was provided by family at bedside.  According to family invasive lysing procedures are not welcome.  Arrival to the emergency room patient has heart rates as low in the mid 30s and his highs low 50s.  Blood pressure is 100/40.  Home meds include digoxin 0.25 as well as metoprolol 50 IV and Lasix 20.  Creatinine is within normal.  He had a creatinine was 2.4.  The elevated dig level to 2.4.  Past Medical History   Ablation, hypertension, dementia Allergies  No Known Allergies   Inpatient Medications  Epinephrine drip   Family History    No family history on file. has no family status information on file.    Social History    Social History   Socioeconomic History  . Marital status: Widowed    Spouse name: Not on file  . Number of children: Not on file  . Years of education: Not on file  . Highest education level: Not on file  Occupational History  . Not on file  Social Needs  . Financial resource  strain: Not on file  . Food insecurity:    Worry: Not on file    Inability: Not on file  . Transportation needs:    Medical: Not on file    Non-medical: Not on file  Tobacco Use  . Smoking status: Not on file  Substance and Sexual Activity  . Alcohol use: Not on file  . Drug use: Not on file  . Sexual activity: Not on file  Lifestyle  . Physical activity:    Days per week: Not on file    Minutes per session: Not on file  . Stress: Not on file  Relationships  . Social connections:    Talks on phone: Not on file    Gets together: Not on file    Attends religious service: Not on file    Active member of club or organization: Not on file    Attends meetings of clubs or organizations: Not on file    Relationship status: Not on file  . Intimate partner violence:    Fear of current or ex partner: Not on file    Emotionally abused: Not on file    Physically abused: Not on file    Forced sexual activity: Not on file  Other Topics Concern  . Not on file  Social History Narrative  . Not on file     Review of Systems    Cardiovascular:  No chest pain, dyspnea  on exertion, edema, orthopnea, palpitations, paroxysmal nocturnal dyspnea. Dermatological: No rash, lesions/masses Respiratory: No cough, dyspnea Urologic: No hematuria, dysuria Abdominal:   No nausea, vomiting, diarrhea, bright red blood per rectum, melena, or hematemesis Neurologic:  No visual changes, wkns, changes in mental status. All other systems reviewed and are otherwise negative except as noted above.  Physical Exam    Blood pressure (!) 90/52, pulse (!) 42, temperature (!) 96.6 F (35.9 C), temperature source Oral, resp. rate 16, SpO2 99 %.  General: Pleasant, no distress Psych: Normal affect. Neuro: Alert and oriented X 3. Moves all extremities spontaneously. HEENT: Normal  Neck: Supple without bruits or JVD. Lungs:  Resp regular and unlabored, CTA. Heart: Slow rate  abdomen: Soft, non-tender,  non-distended, BS + x 4.  Extremities: No clubbing, cyanosis or edema. DP/PT/Radials 2+ and equal bilaterally.  Labs    Troponin [facility] of Care Test) Recent Labs    03/06/18 1825  TROPIPOC 0.30*   No results for input(s): CKTOTAL, CKMB, TROPONINI in the last 72 hours. Lab Results  Component Value Date   WBC 13.2 (H) 03/06/2018   HGB 13.9 03/06/2018   HCT 41.0 03/06/2018   MCV 96.2 03/06/2018   PLT 135 (L) 03/06/2018    Recent Labs  Lab 03/06/18 1821 03/06/18 1827  NA 135 138  K 4.9 5.0  CL 102 102  CO2 22  --   BUN 48* 46*  CREATININE 2.37* 2.40*  CALCIUM 9.3  --   PROT 6.4*  --   BILITOT 0.7  --   ALKPHOS 80  --   ALT 25  --   AST 62*  --   GLUCOSE 155* 149*   No results found for: CHOL, HDL, LDLCALC, TRIG No results found for: [facility]   Radiology Studies    Dg Chest Port 1 View  Result Date: 03/06/2018 CLINICAL DATA:  Chest pain EXAM: PORTABLE CHEST 1 VIEW COMPARISON:  10/21/2013 chest radiograph. FINDINGS: Pacer pads overlie the left chest. Stable cardiomediastinal silhouette with top-normal heart size. No pneumothorax. No pleural effusion. Lungs appear clear, with no acute consolidative airspace disease and no pulmonary edema. IMPRESSION: No active cardiopulmonary disease. Electronically Signed   By: Ilona Sorrel M.D.   On: 03/06/2018 18:49    ECG & Cardiac Imaging    Discernible atrial rhythm, ventricular escape complexes data irregular.  Potentially signifies fibrillation with slow  conduction.  No old EKG for comparison Assessment & Plan    Tachycardia: No history of atrial fibrillation, and CHF.  I have no additional details on the heart failure history.  She is on a number of rate lowering medications including digoxin and metoprolol.  She presents with new onset bradycardia with syncopal episodes.  Also found to have an AK I.  This is significant given the use of digoxin and elevated levels found he on presentation.  She is currently hemodynamic is stable  and appears to maintain as good as it gets for her dementia state.  She is DNR and family does not want to have any advanced life support applied at this time point.  Family appears to be okay with pacing externally via pads if necessary.  Epinephrine is currently off as rates are preserved in the 50s  My  recommendations are: -Since she appears to be euvolemic I would gently hydrate her with hopes to improve renal function.  I hope that with improvement of renal function some of the bradycardia will improve as dig levels will drop.  Obviously  hold metoprolol and digoxin.  If necessary you can consider giving low-dose dopamine at 5 to support of her heart rate. -Agree with Digibind -Recheck lactate -Ongoing discussion with family about goals of care needed. -Will also check UA make sure we are not missing a UTI  Discussed the case with Dr. Nelda Marseille. Given multiple active co morbidities, advanced age and end-stage dementia  this patient would have been well/better served on a medical service. My assessment is not shared by Dr. Nelda Marseille thus I will proceed with admission to Cardiology with ICU assistance by the Medical team. Appreciate the help.   Signed, Cristina Gong, MD 03/06/2018, 9:49 PM  For questions or updates, please contact   Please consult [email or web address] for contact info under Cardiology/STEMI.

## 2018-03-06 NOTE — ED Triage Notes (Signed)
Patient from nursing facility, EMS called out for altered mental status.  Upon EMS arrival patient was unresponsive only responsive to pain.  Heart rate was 25-35 bpm. Facility stated that family was with patient earlier in the day and did not want her transported at the time they were there.  Family called for period of altered mental status and were okay with patient being transported.  Patient being paced by EMS at 70bpm on arrival to ED.  Patient is a DNR with valid DNR papers.

## 2018-03-06 NOTE — ED Notes (Signed)
Date and time results received: 03/06/18 6:25 Pm  Test: Troponin Critical Value: 0.30  Name of Provider Notified: Ayesha Rumpf MD   Orders Received? Or Actions Taken?: Aware, no new orders at this time  Vitals:   03/06/18 1845 03/06/18 1848  BP: (!) 101/41 123/87  Pulse: (!) 37 (!) 35  Resp: 12 11  Temp:    SpO2: 99% 100%

## 2018-03-06 NOTE — Progress Notes (Addendum)
Called by EDP to admit for digoxin toxicity, bradycardia and hypotension, DNR/DNI, pressors only.  Cardiology fellow evaluated and not comfortable admitting due to renal failure.  This is a purely cardiac issue.  PCCM will consult, ground team to evaluate.  Rush Farmer, M.D. [facility] Pulmonary/Critical Care Medicine. Pager: [phone]. After hours pager: [phone].

## 2018-03-07 ENCOUNTER — Encounter (HOSPITAL_COMMUNITY): Payer: Self-pay | Admitting: Pulmonary Disease

## 2018-03-07 ENCOUNTER — Inpatient Hospital Stay (HOSPITAL_COMMUNITY): Payer: Medicare Other

## 2018-03-07 DIAGNOSIS — N179 Acute kidney failure, unspecified: Secondary | ICD-10-CM | POA: Diagnosis not present

## 2018-03-07 DIAGNOSIS — I214 Non-ST elevation (NSTEMI) myocardial infarction: Secondary | ICD-10-CM | POA: Diagnosis not present

## 2018-03-07 DIAGNOSIS — N390 Urinary tract infection, site not specified: Secondary | ICD-10-CM

## 2018-03-07 DIAGNOSIS — R6520 Severe sepsis without septic shock: Secondary | ICD-10-CM

## 2018-03-07 DIAGNOSIS — L8990 Pressure ulcer of unspecified site, unspecified stage: Secondary | ICD-10-CM

## 2018-03-07 DIAGNOSIS — F0390 Unspecified dementia without behavioral disturbance: Secondary | ICD-10-CM | POA: Diagnosis present

## 2018-03-07 DIAGNOSIS — T460X5A Adverse effect of cardiac-stimulant glycosides and drugs of similar action, initial encounter: Secondary | ICD-10-CM | POA: Diagnosis not present

## 2018-03-07 DIAGNOSIS — R570 Cardiogenic shock: Secondary | ICD-10-CM

## 2018-03-07 DIAGNOSIS — T460X1A Poisoning by cardiac-stimulant glycosides and drugs of similar action, accidental (unintentional), initial encounter: Secondary | ICD-10-CM

## 2018-03-07 DIAGNOSIS — A419 Sepsis, unspecified organism: Secondary | ICD-10-CM | POA: Diagnosis not present

## 2018-03-07 DIAGNOSIS — Y92129 Unspecified place in nursing home as the place of occurrence of the external cause: Secondary | ICD-10-CM | POA: Diagnosis not present

## 2018-03-07 DIAGNOSIS — T50905A Adverse effect of unspecified drugs, medicaments and biological substances, initial encounter: Secondary | ICD-10-CM

## 2018-03-07 DIAGNOSIS — R001 Bradycardia, unspecified: Secondary | ICD-10-CM

## 2018-03-07 DIAGNOSIS — I5032 Chronic diastolic (congestive) heart failure: Secondary | ICD-10-CM | POA: Diagnosis not present

## 2018-03-07 DIAGNOSIS — R55 Syncope and collapse: Secondary | ICD-10-CM | POA: Diagnosis not present

## 2018-03-07 DIAGNOSIS — I959 Hypotension, unspecified: Secondary | ICD-10-CM | POA: Diagnosis not present

## 2018-03-07 DIAGNOSIS — I130 Hypertensive heart and chronic kidney disease with heart failure and stage 1 through stage 4 chronic kidney disease, or unspecified chronic kidney disease: Secondary | ICD-10-CM | POA: Diagnosis not present

## 2018-03-07 DIAGNOSIS — E861 Hypovolemia: Secondary | ICD-10-CM | POA: Diagnosis present

## 2018-03-07 DIAGNOSIS — N3001 Acute cystitis with hematuria: Secondary | ICD-10-CM

## 2018-03-07 DIAGNOSIS — I350 Nonrheumatic aortic (valve) stenosis: Secondary | ICD-10-CM | POA: Diagnosis not present

## 2018-03-07 DIAGNOSIS — Z515 Encounter for palliative care: Secondary | ICD-10-CM | POA: Diagnosis not present

## 2018-03-07 DIAGNOSIS — I340 Nonrheumatic mitral (valve) insufficiency: Secondary | ICD-10-CM | POA: Diagnosis not present

## 2018-03-07 DIAGNOSIS — I4510 Unspecified right bundle-branch block: Secondary | ICD-10-CM | POA: Diagnosis present

## 2018-03-07 DIAGNOSIS — I480 Paroxysmal atrial fibrillation: Secondary | ICD-10-CM | POA: Diagnosis present

## 2018-03-07 DIAGNOSIS — N183 Chronic kidney disease, stage 3 (moderate): Secondary | ICD-10-CM | POA: Diagnosis present

## 2018-03-07 DIAGNOSIS — E039 Hypothyroidism, unspecified: Secondary | ICD-10-CM | POA: Diagnosis present

## 2018-03-07 DIAGNOSIS — Z7189 Other specified counseling: Secondary | ICD-10-CM | POA: Diagnosis not present

## 2018-03-07 DIAGNOSIS — Z66 Do not resuscitate: Secondary | ICD-10-CM | POA: Diagnosis present

## 2018-03-07 DIAGNOSIS — E872 Acidosis: Secondary | ICD-10-CM | POA: Diagnosis not present

## 2018-03-07 DIAGNOSIS — C50911 Malignant neoplasm of unspecified site of right female breast: Secondary | ICD-10-CM | POA: Diagnosis not present

## 2018-03-07 LAB — LACTIC ACID, PLASMA
LACTIC ACID, VENOUS: 3.2 mmol/L — AB (ref 0.5–1.9)
LACTIC ACID, VENOUS: 3.6 mmol/L — AB (ref 0.5–1.9)
Lactic Acid, Venous: 3.9 mmol/L (ref 0.5–1.9)
Lactic Acid, Venous: 2.9 mmol/L (ref 0.5–1.9)

## 2018-03-07 LAB — URINALYSIS, ROUTINE W REFLEX MICROSCOPIC
Bilirubin Urine: NEGATIVE
GLUCOSE, UA: NEGATIVE mg/dL
Ketones, ur: NEGATIVE mg/dL
NITRITE: NEGATIVE
PH: 5 (ref 5.0–8.0)
Protein, ur: 30 mg/dL — AB
SPECIFIC GRAVITY, URINE: 1.013 (ref 1.005–1.030)

## 2018-03-07 LAB — BASIC METABOLIC PANEL
Anion gap: 11 (ref 5–15)
BUN: 42 mg/dL — AB (ref 6–20)
CHLORIDE: 110 mmol/L (ref 101–111)
CO2: 16 mmol/L — ABNORMAL LOW (ref 22–32)
CREATININE: 1.95 mg/dL — AB (ref 0.44–1.00)
Calcium: 8.6 mg/dL — ABNORMAL LOW (ref 8.9–10.3)
GFR calc Af Amer: 26 mL/min — ABNORMAL LOW (ref >60)
GFR, EST NON AFRICAN AMERICAN: 22 mL/min — AB (ref >60)
GLUCOSE: 130 mg/dL — AB (ref 65–99)
Potassium: 4.5 mmol/L (ref 3.5–5.1)
SODIUM: 137 mmol/L (ref 135–145)

## 2018-03-07 LAB — CORTISOL: Cortisol, Plasma: 20.2 ug/dL

## 2018-03-07 LAB — TROPONIN I
TROPONIN I: 0.23 ng/mL — AB (ref <0.03)
Troponin I: 0.17 ng/mL (ref <0.03)

## 2018-03-07 LAB — MRSA PCR SCREENING: MRSA by PCR: POSITIVE — AB

## 2018-03-07 LAB — PROTIME-INR
INR: 1.24
Prothrombin Time: 15.5 seconds — ABNORMAL HIGH (ref 11.4–15.2)

## 2018-03-07 LAB — CREATININE, URINE, RANDOM: CREATININE, URINE: 135.87 mg/dL

## 2018-03-07 LAB — TSH: TSH: 1.435 u[IU]/mL (ref 0.350–4.500)

## 2018-03-07 LAB — SODIUM, URINE, RANDOM: SODIUM UR: 32 mmol/L

## 2018-03-07 LAB — PROCALCITONIN: Procalcitonin: 0.21 ng/mL

## 2018-03-07 LAB — T4, FREE: FREE T4: 1.23 ng/dL — AB (ref 0.61–1.12)

## 2018-03-07 MED ORDER — CHLORHEXIDINE GLUCONATE CLOTH 2 % EX PADS
6.0000 | MEDICATED_PAD | Freq: Every day | CUTANEOUS | Status: DC
  Administered 2018-03-07 – 2018-03-08 (×2): 6 via TOPICAL

## 2018-03-07 MED ORDER — SODIUM CHLORIDE 0.9 % IV SOLN
1.0000 g | INTRAVENOUS | Status: DC
  Administered 2018-03-07 – 2018-03-08 (×2): 1 g via INTRAVENOUS
  Filled 2018-03-07 (×2): qty 1

## 2018-03-07 MED ORDER — SODIUM CHLORIDE 0.9 % IV BOLUS (SEPSIS): 500.0000 mL | Freq: Once | INTRAVENOUS | Status: DC

## 2018-03-07 MED ORDER — ACETAMINOPHEN 325 MG PO TABS
650.0000 mg | ORAL_TABLET | ORAL | Status: DC | PRN
  Administered 2018-03-11: 650 mg via ORAL
  Filled 2018-03-07 (×2): qty 2

## 2018-03-07 MED ORDER — SODIUM CHLORIDE 0.9 % IV SOLN
INTRAVENOUS | Status: DC
  Administered 2018-03-07: 22:00:00 via INTRAVENOUS

## 2018-03-07 MED ORDER — SODIUM CHLORIDE 0.9 % IV BOLUS
800.0000 mL | Freq: Once | INTRAVENOUS | Status: AC
  Administered 2018-03-07: 800 mL via INTRAVENOUS

## 2018-03-07 MED ORDER — MUPIROCIN 2 % EX OINT
1.0000 "application " | TOPICAL_OINTMENT | Freq: Two times a day (BID) | CUTANEOUS | Status: DC
  Administered 2018-03-07 – 2018-03-11 (×8): 1 via NASAL
  Filled 2018-03-07 (×2): qty 22

## 2018-03-07 MED ORDER — ONDANSETRON HCL 4 MG/2ML IJ SOLN: 4.0000 mg | Freq: Four times a day (QID) | INTRAMUSCULAR | Status: DC | PRN

## 2018-03-07 MED ORDER — LEVOTHYROXINE SODIUM 25 MCG PO TABS
25.0000 ug | ORAL_TABLET | Freq: Every day | ORAL | Status: DC
  Administered 2018-03-07 – 2018-03-11 (×5): 25 ug via ORAL
  Filled 2018-03-07 (×5): qty 1

## 2018-03-07 MED ORDER — DOPAMINE-DEXTROSE 3.2-5 MG/ML-% IV SOLN
0.0000 ug/kg/min | INTRAVENOUS | Status: DC
  Administered 2018-03-08: 8 ug/kg/min via INTRAVENOUS
  Administered 2018-03-09: 9 ug/kg/min via INTRAVENOUS
  Filled 2018-03-07 (×2): qty 250

## 2018-03-07 MED ORDER — DOPAMINE-DEXTROSE 3.2-5 MG/ML-% IV SOLN: 0.0000 ug/kg/min | INTRAVENOUS | Status: DC

## 2018-03-07 NOTE — Progress Notes (Signed)
CRITICAL VALUE ALERT  Critical Value: lactic acid  Date & Time Notied:  4/3 0715   RN Notified: Estill Bamberg to notify MD  Orders Received/Actions taken:none

## 2018-03-07 NOTE — Consult Note (Signed)
Name: Jo Terry MRN: [id number] DOB: 1930/05/24    ADMISSION DATE:  03/06/2018 CONSULTATION DATE:  4/2  REFERRING MD :  Dr. Ralene Bathe EDP  CHIEF COMPLAINT:  Digoxin overdose   HISTORY OF PRESENT ILLNESS:  84 year old female with PMH significant for CHF, hypothyroid, Atrial fibrillation, and dementia. She resides in skilled nursing facility where she was noted to be somewhat altered 3/30, but returned to baseline the following day. Then 4/2 during physical therapy she became acutely altered and eventually unresponsive. EMS was called. Upon their arrival her HR was noted to be 28. She was transported to [facility] ED. It was noted that her medication list contains lopressor and digoxin. She was given atropine and digifab in ED for supratherapeutic digoxin level. HR and Blood pressure somewhat improved. Cardiology to admit. PCCM asked to consult. Of note, she presents to ED with DNR documentation.    SIGNIFICANT EVENTS  4/2 admit  STUDIES:  CT head 4/3 >  PAST MEDICAL HISTORY :   has no past medical history on file.  has no past surgical history on file. Prior to Admission medications   Medication Sig Start Date End Date Taking? Authorizing Provider  allopurinol (ZYLOPRIM) 100 MG tablet Take 100 mg by mouth 2 (two) times daily.   Yes [provider]  aspirin 81 MG chewable tablet Chew 81 mg by mouth daily.   Yes [provider]  digoxin (LANOXIN) 0.25 MG tablet Take 0.25 mg by mouth daily.   Yes [provider]  donepezil (ARICEPT) 10 MG tablet Take 10 mg by mouth at bedtime.   Yes [provider]  furosemide (LASIX) 20 MG tablet Take 20 mg by mouth 2 (two) times daily.   Yes [provider]  levothyroxine (SYNTHROID, LEVOTHROID) 25 MCG tablet Take 25 mcg by mouth daily before breakfast.   Yes [provider]  loratadine (CLARITIN) 10 MG tablet Take 10 mg by mouth daily.   Yes [provider]  memantine (NAMENDA XR) 28 MG CP24  24 hr capsule Take 28 mg by mouth daily.   Yes [provider]  metoprolol tartrate (LOPRESSOR) 50 MG tablet Take 50 mg by mouth 2 (two) times daily.   Yes [provider]  ofloxacin (OCUFLOX) 0.3 % ophthalmic solution Place 1 drop into both eyes every 4 (four) hours. [id number]   Yes [provider]  potassium chloride SA (K-DUR,KLOR-CON) 20 MEQ tablet Take 20 mEq by mouth 2 (two) times daily.   Yes [provider]  Selenium Sulfide 2.25 % SHAM Apply topically See admin instructions. Apply to scalp at night, leave on overnight before shower days, then rinse   Yes [provider]   No Known Allergies  FAMILY HISTORY:  family history is not on file. SOCIAL HISTORY:    REVIEW OF SYSTEMS:  Limited due to encephalopathy  SUBJECTIVE:   VITAL SIGNS: Temp:  [96.6 F (35.9 C)] 96.6 F (35.9 C) (04/02 1828) Pulse Rate:  [33-52] 34 (04/03 0000) Resp:  [11-20] 19 (04/03 0000) BP: (86-130)/(36-92) 86/76 (04/03 0000) SpO2:  [96 %-100 %] 100 % (04/03 0000) Weight:  [50 kg (110 lb 3.7 oz)-67.5 kg (148 lb 13 oz)] 67.5 kg (148 lb 13 oz) (04/03 0010)  PHYSICAL EXAMINATION: General:  Frail elderly female in NAD Neuro:  Awake, Alert, oriented to self only.  HEENT:  [place]/AT, PERRL, no JVD. Erythema to L conjunctiva.  Cardiovascular:  Loletha Grayer, regular, no MRG Lungs:  Clear bilateral breath sounds Abdomen:  Soft, non-tender, non-distended Musculoskeletal:  No acute deformity Skin:  Grossly intact.   Recent Labs  Lab 03/06/18 1821 03/06/18 1827  NA 135 138  K 4.9 5.0  CL 102 102  CO2 22  --   BUN 48* 46*  CREATININE 2.37* 2.40*  GLUCOSE 155* 149*   Recent Labs  Lab 03/06/18 1821 03/06/18 1827  HGB 13.5 13.9  HCT 41.0 41.0  WBC 13.2*  --   PLT 135*  --    Dg Chest Port 1 View  Result Date: 03/06/2018 CLINICAL DATA:  Chest pain EXAM: PORTABLE CHEST 1 VIEW COMPARISON:  10/21/2013 chest radiograph. FINDINGS: Pacer pads overlie the left chest.  Stable cardiomediastinal silhouette with top-normal heart size. No pneumothorax. No pleural effusion. Lungs appear clear, with no acute consolidative airspace disease and no pulmonary edema. IMPRESSION: No active cardiopulmonary disease. Electronically Signed   By: Ilona Sorrel M.D.   On: 03/06/2018 18:49    ASSESSMENT / PLAN:  Bradycardia: medication related. Supratherapeutic digoxin levels, as well as scheduled metoprolol.  - Digibind and atropine given in ED - Echocardiogram - Starting dopamine - Check TSH - Hold home metop, dig.  - Per cardiology  Hypotension - Continue IVF resuscitation  Acute renal failure - hydrate and repeat BMP in AM  SIRS - no infectious source clear - blood, urine cultures - assess lactic  Georgann Housekeeper, AGACNP-BC [facility] Pulmonology/Critical Care Pager [phone] or [phone]  03/07/2018 12:56 AM

## 2018-03-07 NOTE — H&P (Signed)
See H and P by Dr. Cristina Gong labeled Cardiology Consult

## 2018-03-07 NOTE — Progress Notes (Signed)
Pharmacy Antibiotic Note  Jo Terry is a 84 y.o. female admitted on 03/06/2018 with UTI.  Pharmacy has been consulted for Cefepime dosing. WBC 13.2. Noted renal dysfunction.   Plan: Cefepime 1g IV q24h Trend WBC, temp, renal function  F/U urine culture for directed therapy  Weight: 148 lb 13 oz (67.5 kg)  Temp (24hrs), Avg:97 F (36.1 C), Min:96.6 F (35.9 C), Max:97.3 F (36.3 C)  Recent Labs  Lab 03/06/18 1821 03/06/18 1827 03/07/18 0021 03/07/18 0218  WBC 13.2*  --   --   --   CREATININE 2.37* 2.40*  --   --   LATICACIDVEN  --   --  3.9* 3.2*    CrCl cannot be calculated (Unknown ideal weight.).    No Known Allergies   Jo Terry 03/07/2018 4:55 AM

## 2018-03-07 NOTE — Progress Notes (Signed)
Name: Jo Terry MRN: [id number] DOB: 05-15-1930    ADMISSION DATE:  03/06/2018 CONSULTATION DATE:  4/2  REFERRING MD :  Dr. Ralene Bathe EDP  CHIEF COMPLAINT:  Digoxin overdose   HISTORY OF PRESENT ILLNESS:  84 year old female with PMH significant for CHF, hypothyroid, Atrial fibrillation, and dementia. She resides in skilled nursing facility where she was noted to be somewhat altered 3/30, but returned to baseline the following day. Then 4/2 during physical therapy she became acutely altered and eventually unresponsive. EMS was called. Upon their arrival her HR was noted to be 28. She was transported to [facility] ED. It was noted that her medication list contains lopressor and digoxin. She was given atropine and digifab in ED for supratherapeutic digoxin level. HR and Blood pressure somewhat improved. Cardiology to admit. PCCM asked to consult. Of note, she presents to ED with DNR documentation.    SIGNIFICANT EVENTS  4/2 admit  STUDIES:  CT head 4/3 > no acute abnormality chronic changes    SUBJECTIVE:  Elderly female is awake alert no acute distress  VITAL SIGNS: Temp:  [96.6 F (35.9 C)-98.1 F (36.7 C)] 98.1 F (36.7 C) (04/03 0730) Pulse Rate:  [29-66] 49 (04/03 0845) Resp:  [11-20] 18 (04/03 0845) BP: (69-195)/(28-180) 90/41 (04/03 0845) SpO2:  [95 %-100 %] 97 % (04/03 0845) Weight:  [50 kg (110 lb 3.7 oz)-67.5 kg (148 lb 13 oz)] 63.9 kg (140 lb 14 oz) (04/03 0115)  PHYSICAL EXAMINATION: General: Frail elderly female no acute distress HEENT: No adenopathy left edema appreciated PSY: Demented but awake alert and witty Neuro: Dementia, not orientated to time person place, able to speak in full sentences CV: Heart sounds are regular with sinus bradycardia of 56 PULM: Decreased breath sounds at the base ZH:GDJM, non-tender, bsx4 active  Extremities: warm/dry, negative edema  Skin: Right breast mass that is exposed white in nature approximately quarter inch in  diameter   Recent Labs  Lab 03/06/18 1821 03/06/18 1827  NA 135 138  K 4.9 5.0  CL 102 102  CO2 22  --   BUN 48* 46*  CREATININE 2.37* 2.40*  GLUCOSE 155* 149*   Recent Labs  Lab 03/06/18 1821 03/06/18 1827  HGB 13.5 13.9  HCT 41.0 41.0  WBC 13.2*  --   PLT 135*  --    Ct Head Wo Contrast  Result Date: 03/07/2018 CLINICAL DATA:  Altered mental status EXAM: CT HEAD WITHOUT CONTRAST TECHNIQUE: Contiguous axial images were obtained from the base of the skull through the vertex without intravenous contrast. COMPARISON:  None. FINDINGS: Brain: No mass lesion, intraparenchymal hemorrhage or extra-axial collection. No evidence of acute cortical infarct. There is periventricular hypoattenuation compatible with chronic microvascular disease. Vascular: No hyperdense vessel or unexpected vascular calcification. Skull: Normal visualized skull base, calvarium and extracranial soft tissues. Sinuses/Orbits: No sinus fluid levels or advanced mucosal thickening. No mastoid effusion. Normal orbits. IMPRESSION: Chronic small vessel disease without acute intracranial abnormality. Electronically Signed   By: Ulyses Jarred M.D.   On: 03/07/2018 01:19   Dg Chest Port 1 View  Result Date: 03/06/2018 CLINICAL DATA:  Chest pain EXAM: PORTABLE CHEST 1 VIEW COMPARISON:  10/21/2013 chest radiograph. FINDINGS: Pacer pads overlie the left chest. Stable cardiomediastinal silhouette with top-normal heart size. No pneumothorax. No pleural effusion. Lungs appear clear, with no acute consolidative airspace disease and no pulmonary edema. IMPRESSION: No active cardiopulmonary disease. Electronically Signed   By: Ilona Sorrel M.D.   On: 03/06/2018  18:49    ASSESSMENT / PLAN:  Bradycardia: medication related. Supratherapeutic digoxin levels, as well as scheduled metoprolol.   P: Currently on dopamine drip with heart rate in 50s and adequate blood pressure.  Suspect she can come off dopamine as her digitalis levels drop  and hold beta-blockers. Treated with atropine x1 DigiFab given Hydration  Hypotension P: Hydration Currently on dopamine for heart rate   Acute renal failure Lab Results  Component Value Date   CREATININE 2.40 (H) 03/06/2018   CREATININE 2.37 (H) 03/06/2018   Recent Labs  Lab 03/06/18 1821 03/06/18 1827  K 4.9 5.0    P: Follow renal function Monitor electrolytes  SIRS -suspected UTI 03/07/2018 lactic acid 3.6 P: Pan culture Currently on cefepime   Old left breat wound ? Old cancer site  P: Right breast noted to have quarter inch diameter hardened white mass.  No records to indicate what this can be appears to be old in nature.  Question of some type of old cancer.  Richardson Landry Catelynn Sparger ACNP Maryanna Shape PCCM Pager [phone] till 1 pm If no answer page [phone] 03/07/2018, 9:11 AM

## 2018-03-07 NOTE — Progress Notes (Signed)
CRITICAL VALUE ALERT  Critical Value:  Lactic acid 2.9  Date & Time Notied:  03/07/18 1550  Provider Notified: N/A  Orders Received/Actions taken: Improved since previous level. Will monitor.   Joellen Jersey, RN

## 2018-03-07 NOTE — Progress Notes (Signed)
Lab unable to get blood. Dr. Sallyanne Kuster made aware. New orders received. Spoke with lab rescheduled all labs to 1430.

## 2018-03-07 NOTE — Progress Notes (Signed)
Progress Note  Patient Name: Jo Terry Date of Encounter: 03/07/2018  Primary Cardiologist: No primary care provider on file.   Subjective   Awake, alert. Non-verbal, but appears calm and comfortable. On a very low dose of IV Dopamine, BP roughly 90/50. Sinus bradycardia with long PR and RBBB, 50-55 bpm.  Inpatient Medications    Scheduled Meds: . levothyroxine  25 mcg Oral QAC breakfast   Continuous Infusions: . sodium chloride    . ceFEPime (MAXIPIME) IV Stopped (03/07/18 0716)  . DOPamine    . epinephrine Stopped (03/06/18 2103)  . sodium chloride     PRN Meds: acetaminophen, ondansetron (ZOFRAN) IV   Vital Signs    Vitals:   03/07/18 0715 03/07/18 0730 03/07/18 0745 03/07/18 0800  BP: (!) 93/50 100/86 (!) 86/46 (!) 94/47  Pulse: (!) 54 (!) 55 (!) 53 (!) 51  Resp: 12 14 13 13   Temp:  98.1 F (36.7 C)    TempSrc:  Oral    SpO2: 98% 97% 98% 96%  Weight:      Height:        Intake/Output Summary (Last 24 hours) at 03/07/2018 0812 Last data filed at 03/07/2018 0700 Gross per 24 hour  Intake 2212.78 ml  Output 50 ml  Net 2162.78 ml   Filed Weights   03/07/18 0000 03/07/18 0010 03/07/18 0115  Weight: 110 lb 3.7 oz (50 kg) 148 lb 13 oz (67.5 kg) 140 lb 14 oz (63.9 kg)    Telemetry    Marked sinus brady versus junctional rhythm overnight - Personally Reviewed  ECG    Sinus bradycardia with long PR and RBBB, 50-55 bpm. - Personally Reviewed  Physical Exam  Alert, follows with gaze, but not talking to me GEN: No acute distress.   Neck: No JVD Cardiac: RRR, widely split S2, no murmurs, rubs, or gallops.  Respiratory: Clear to auscultation bilaterally. GI: Soft, nontender, non-distended  MS: No edema; No deformity. Neuro:  Nonfocal  Psych: Normal affect   Labs    Chemistry Recent Labs  Lab 03/06/18 1821 03/06/18 1827  NA 135 138  K 4.9 5.0  CL 102 102  CO2 22  --   GLUCOSE 155* 149*  BUN 48* 46*  CREATININE 2.37* 2.40*  CALCIUM 9.3  --     PROT 6.4*  --   ALBUMIN 3.0*  --   AST 62*  --   ALT 25  --   ALKPHOS 80  --   BILITOT 0.7  --   GFRNONAA 18*  --   GFRAA 20*  --   ANIONGAP 11  --      Hematology Recent Labs  Lab 03/06/18 1821 03/06/18 1827  WBC 13.2*  --   RBC 4.26  --   HGB 13.5 13.9  HCT 41.0 41.0  MCV 96.2  --   MCH 31.7  --   MCHC 32.9  --   RDW 15.0  --   PLT 135*  --     Cardiac Enzymes Recent Labs  Lab 03/07/18 0149  TROPONINI 0.23*    Recent Labs  Lab 03/06/18 1825  TROPIPOC 0.30*     BNPNo results for input(s): BNP, PROBNP in the last 168 hours.   DDimer No results for input(s): DDIMER in the last 168 hours.   Radiology    Ct Head Wo Contrast  Result Date: 03/07/2018 CLINICAL DATA:  Altered mental status EXAM: CT HEAD WITHOUT CONTRAST TECHNIQUE: Contiguous axial images were obtained from the base  of the skull through the vertex without intravenous contrast. COMPARISON:  None. FINDINGS: Brain: No mass lesion, intraparenchymal hemorrhage or extra-axial collection. No evidence of acute cortical infarct. There is periventricular hypoattenuation compatible with chronic microvascular disease. Vascular: No hyperdense vessel or unexpected vascular calcification. Skull: Normal visualized skull base, calvarium and extracranial soft tissues. Sinuses/Orbits: No sinus fluid levels or advanced mucosal thickening. No mastoid effusion. Normal orbits. IMPRESSION: Chronic small vessel disease without acute intracranial abnormality. Electronically Signed   By: Ulyses Jarred M.D.   On: 03/07/2018 01:19   Dg Chest Port 1 View  Result Date: 03/06/2018 CLINICAL DATA:  Chest pain EXAM: PORTABLE CHEST 1 VIEW COMPARISON:  10/21/2013 chest radiograph. FINDINGS: Pacer pads overlie the left chest. Stable cardiomediastinal silhouette with top-normal heart size. No pneumothorax. No pleural effusion. Lungs appear clear, with no acute consolidative airspace disease and no pulmonary edema. IMPRESSION: No active  cardiopulmonary disease. Electronically Signed   By: Ilona Sorrel M.D.   On: 03/06/2018 18:49    Cardiac Studies    Patient Profile     84 y.o. female with advanced dementia and severe drug-induced bradycardia (digoxin and beta blockers), improving heart rate after digibind and low dose Dopamine. Indication for dig/metoprolol may be atrial fibrillation, but no records available. Due to dementia, family wants conservative management, without invasive procedures.  Assessment & Plan    Keep on IV dopamine for time being. At this point bradycardia is due to beta blocker effect and underlying age related conduction system disease. Expect HR to return to baseline in next 24 hours or so.  If AF recurs, will not give digoxin, use lower dose of beta blocker, as needed for palliation of symptoms related to the arrhythmia, without necessarily following a goal heart rate. Note renal insufficiency and lactacidosis, likely related to bradycardia-induced low output. Baseline unknown. Was also on furosemide (low dose 20 mg daily), presumably for CHF. Clinically euvolemic, will hold diuretics. Check TSH. Marginal troponin increase is not clinically significant.  For questions or updates, please contact [place] Please consult [email or web address] for contact info under Cardiology/STEMI.      Signed, Sanda Klein, MD  03/07/2018, 8:12 AM

## 2018-03-07 NOTE — CV Procedure (Signed)
Attempted 2D Echo, patient is with other staff in room, will try again at a later time.  Jo Terry

## 2018-03-07 NOTE — Progress Notes (Signed)
CRITICAL VALUE ALERT  Critical Value:  Lactic 3.6  Date & Time Notied:  03/07/2018 0800 Provider Notified: Richardson Landry Minor NP  Orders Received/Actions taken: no new orders at this time

## 2018-03-08 ENCOUNTER — Inpatient Hospital Stay (HOSPITAL_COMMUNITY): Payer: Medicare Other

## 2018-03-08 ENCOUNTER — Other Ambulatory Visit: Payer: Self-pay

## 2018-03-08 DIAGNOSIS — N179 Acute kidney failure, unspecified: Secondary | ICD-10-CM

## 2018-03-08 DIAGNOSIS — Z7189 Other specified counseling: Secondary | ICD-10-CM

## 2018-03-08 DIAGNOSIS — Z515 Encounter for palliative care: Secondary | ICD-10-CM

## 2018-03-08 DIAGNOSIS — C50911 Malignant neoplasm of unspecified site of right female breast: Secondary | ICD-10-CM

## 2018-03-08 DIAGNOSIS — I340 Nonrheumatic mitral (valve) insufficiency: Secondary | ICD-10-CM

## 2018-03-08 LAB — BASIC METABOLIC PANEL
ANION GAP: 8 (ref 5–15)
BUN: 39 mg/dL — ABNORMAL HIGH (ref 6–20)
CHLORIDE: 112 mmol/L — AB (ref 101–111)
CO2: 17 mmol/L — ABNORMAL LOW (ref 22–32)
Calcium: 8.4 mg/dL — ABNORMAL LOW (ref 8.9–10.3)
Creatinine, Ser: 1.6 mg/dL — ABNORMAL HIGH (ref 0.44–1.00)
GFR calc Af Amer: 33 mL/min — ABNORMAL LOW (ref >60)
GFR, EST NON AFRICAN AMERICAN: 28 mL/min — AB (ref >60)
Glucose, Bld: 112 mg/dL — ABNORMAL HIGH (ref 65–99)
Potassium: 3.5 mmol/L (ref 3.5–5.1)
SODIUM: 137 mmol/L (ref 135–145)

## 2018-03-08 LAB — BLOOD CULTURE ID PANEL (REFLEXED)
ACINETOBACTER BAUMANNII: NOT DETECTED
CANDIDA GLABRATA: NOT DETECTED
CANDIDA KRUSEI: NOT DETECTED
Candida albicans: NOT DETECTED
Candida parapsilosis: NOT DETECTED
Candida tropicalis: NOT DETECTED
ENTEROBACTER CLOACAE COMPLEX: NOT DETECTED
ENTEROCOCCUS SPECIES: NOT DETECTED
ESCHERICHIA COLI: NOT DETECTED
Enterobacteriaceae species: NOT DETECTED
Haemophilus influenzae: NOT DETECTED
Klebsiella oxytoca: NOT DETECTED
Klebsiella pneumoniae: NOT DETECTED
LISTERIA MONOCYTOGENES: NOT DETECTED
Methicillin resistance: NOT DETECTED
NEISSERIA MENINGITIDIS: NOT DETECTED
PSEUDOMONAS AERUGINOSA: NOT DETECTED
Proteus species: NOT DETECTED
STAPHYLOCOCCUS AUREUS BCID: NOT DETECTED
STREPTOCOCCUS AGALACTIAE: NOT DETECTED
STREPTOCOCCUS PNEUMONIAE: NOT DETECTED
STREPTOCOCCUS SPECIES: NOT DETECTED
Serratia marcescens: NOT DETECTED
Staphylococcus species: DETECTED — AB
Streptococcus pyogenes: NOT DETECTED

## 2018-03-08 LAB — GLUCOSE, CAPILLARY: GLUCOSE-CAPILLARY: 96 mg/dL (ref 65–99)

## 2018-03-08 LAB — ECHOCARDIOGRAM COMPLETE
Height: 63 in
WEIGHTICAEL: 2222.24 [oz_av]

## 2018-03-08 MED ORDER — ENSURE ENLIVE PO LIQD
237.0000 mL | Freq: Two times a day (BID) | ORAL | Status: DC
  Administered 2018-03-08 – 2018-03-10 (×3): 237 mL via ORAL

## 2018-03-08 MED ORDER — SODIUM CHLORIDE 0.9 % IV SOLN
INTRAVENOUS | Status: DC
  Administered 2018-03-08: 09:00:00 via INTRAVENOUS

## 2018-03-08 MED ORDER — DEXTROSE 5 % IV SOLN
500.0000 mg | INTRAVENOUS | Status: DC
  Administered 2018-03-09 – 2018-03-11 (×3): 500 mg via INTRAVENOUS
  Filled 2018-03-08 (×3): qty 0.5

## 2018-03-08 NOTE — Progress Notes (Signed)
Discussed the patient's clinical progress and our care plan with her sister and medical POA, [name]. Her home phone, listed in the computer is her preferred contact number, but she does have a mobile number as well [phone]. MCr

## 2018-03-08 NOTE — Progress Notes (Signed)
  Echocardiogram 2D Echocardiogram has been performed.  Jo Terry 03/08/2018, 11:32 AM

## 2018-03-08 NOTE — Progress Notes (Signed)
Progress Note  Patient Name: Jo Terry Date of Encounter: 03/08/2018  Primary Cardiologist: No primary care provider on file.   Subjective   Complains of fatigue, denies dyspnea.  Verbal and cooperative, but confused and disoriented. Remains in sinus bradycardia with rate in 50s, occasional regular accelerated rhythm 90s (ectopic atrial versus junctional?). atrial fibrillation has not yet been seen. Still on IV dopamine due to hypotension. UO remains scanty. Creatinine is better. Echo not yet done.   Inpatient Medications    Scheduled Meds: . Chlorhexidine Gluconate Cloth  6 each Topical Q0600  . levothyroxine  25 mcg Oral QAC breakfast  . mupirocin ointment  1 application Nasal BID   Continuous Infusions: . sodium chloride 75 mL/hr at 03/07/18 2145  . sodium chloride    . ceFEPime (MAXIPIME) IV Stopped (03/08/18 0510)  . DOPamine 8 mcg/kg/min (03/08/18 0536)  . epinephrine Stopped (03/06/18 2103)  . sodium chloride     PRN Meds: acetaminophen, ondansetron (ZOFRAN) IV   Vital Signs    Vitals:   03/08/18 0645 03/08/18 0700 03/08/18 0715 03/08/18 0753  BP: (!) 101/57 (!) 105/44 (!) 98/44 (!) 106/47  Pulse: 65 63 63   Resp: '13 13 13   '$ Temp:    97.6 F (36.4 C)  TempSrc:    Oral  SpO2: 96% 97% 97%   Weight:      Height:        Intake/Output Summary (Last 24 hours) at 03/08/2018 0833 Last data filed at 03/08/2018 0700 Gross per 24 hour  Intake 2499.34 ml  Output 720 ml  Net 1779.34 ml   Filed Weights   03/07/18 0010 03/07/18 0115 03/08/18 0515  Weight: 148 lb 13 oz (67.5 kg) 140 lb 14 oz (63.9 kg) 138 lb 14.2 oz (63 kg)    Telemetry    Sinus brady, brief sustained accelerated junctional at times - Personally Reviewed  ECG    No new tracing - Personally Reviewed  Physical Exam  Sleepy, easy to wake. Disoriented. GEN: No acute distress.   Neck: No JVD Cardiac: RRR, split S2, no murmurs, rubs, or gallops.  Respiratory: Clear to auscultation bilaterally.  Left breast cancer with extensive skin involvement. GI: Soft, nontender, non-distended  MS: No edema; No deformity. Small area  Neuro:  Nonfocal  Psych: Normal affect   Labs    Chemistry Recent Labs  Lab 03/06/18 1821 03/06/18 1827 03/07/18 1458 03/08/18 0358  NA 135 138 137 137  K 4.9 5.0 4.5 3.5  CL 102 102 110 112*  CO2 22  --  16* 17*  GLUCOSE 155* 149* 130* 112*  BUN 48* 46* 42* 39*  CREATININE 2.37* 2.40* 1.95* 1.60*  CALCIUM 9.3  --  8.6* 8.4*  PROT 6.4*  --   --   --   ALBUMIN 3.0*  --   --   --   AST 62*  --   --   --   ALT 25  --   --   --   ALKPHOS 80  --   --   --   BILITOT 0.7  --   --   --   GFRNONAA 18*  --  22* 28*  GFRAA 20*  --  26* 33*  ANIONGAP 11  --  11 8     Hematology Recent Labs  Lab 03/06/18 1821 03/06/18 1827  WBC 13.2*  --   RBC 4.26  --   HGB 13.5 13.9  HCT 41.0 41.0  MCV 96.2  --  MCH 31.7  --   MCHC 32.9  --   RDW 15.0  --   PLT 135*  --     Cardiac Enzymes Recent Labs  Lab 03/07/18 0149 03/07/18 1458  TROPONINI 0.23* 0.17*    Recent Labs  Lab 03/06/18 1825  TROPIPOC 0.30*     BNPNo results for input(s): BNP, PROBNP in the last 168 hours.   DDimer No results for input(s): DDIMER in the last 168 hours.   Radiology    Ct Head Wo Contrast  Result Date: 03/07/2018 CLINICAL DATA:  Altered mental status EXAM: CT HEAD WITHOUT CONTRAST TECHNIQUE: Contiguous axial images were obtained from the base of the skull through the vertex without intravenous contrast. COMPARISON:  None. FINDINGS: Brain: No mass lesion, intraparenchymal hemorrhage or extra-axial collection. No evidence of acute cortical infarct. There is periventricular hypoattenuation compatible with chronic microvascular disease. Vascular: No hyperdense vessel or unexpected vascular calcification. Skull: Normal visualized skull base, calvarium and extracranial soft tissues. Sinuses/Orbits: No sinus fluid levels or advanced mucosal thickening. No mastoid effusion.  Normal orbits. IMPRESSION: Chronic small vessel disease without acute intracranial abnormality. Electronically Signed   By: Ulyses Jarred M.D.   On: 03/07/2018 01:19   Dg Chest Port 1 View  Result Date: 03/06/2018 CLINICAL DATA:  Chest pain EXAM: PORTABLE CHEST 1 VIEW COMPARISON:  10/21/2013 chest radiograph. FINDINGS: Pacer pads overlie the left chest. Stable cardiomediastinal silhouette with top-normal heart size. No pneumothorax. No pleural effusion. Lungs appear clear, with no acute consolidative airspace disease and no pulmonary edema. IMPRESSION: No active cardiopulmonary disease. Electronically Signed   By: Ilona Sorrel M.D.   On: 03/06/2018 18:49    Cardiac Studies   Echo pending  Patient Profile     84 y.o. female with advanced dementia and severe drug-induced bradycardia (digoxin and beta blockers), improving heart rate after digibind and low dose Dopamine. Indication for dig/metoprolol may be atrial fibrillation, but no records available. Due to dementia, family wants conservative management, without invasive procedures.  Assessment & Plan    1. Bradycardia: largely resolved, although still with some suggestion of dig and beta blocker effect. She does not need a pacemaker. 2. Hypotension:  At this point, I believe this may be mostly related to hypovolemia, will give another bolus of IVF. She is not eating or drinking. On cefepime monotherapy for presumed sepsis. No positive cultures to date (urine culture not submitted before antibiotics). 3. AFib: reported in past, not seen during this hospitalization. I would not treat with antiarrhythmics or rate control meds if it does occur.  4. Reported breast CA:  Do not have firm documentation of this, but her exam is definitely compatible with the diagnosis. 5. Back wound: wound care to see, could be an unusual pressure sore, but worry about a skin met as well. 6. Dementia: I have consulted palliative care. I do not think she is suited for  aggressive medical care. 7. Renal insufficiency: improving with rehydration. She is probably still dry. 8. Hypothyroidism: TSH is normal. Mildly elevated free T4 may be sick euthyroid sd. No adjustment to supplement.  For questions or updates, please contact [place] Please consult [email or web address] for contact info under Cardiology/STEMI.      Signed, Sanda Klein, MD  03/08/2018, 8:33 AM

## 2018-03-08 NOTE — Progress Notes (Signed)
Initial Nutrition Assessment  DOCUMENTATION CODES:   Severe malnutrition in context of chronic illness  INTERVENTION:   Ensure Enlive po BID, each supplement provides 350 kcal and 20 grams of protein  Recommend liberalizing diet to Regular  Magic cup BID with meals, each supplement provides 290 kcal and 9 grams of protein  NUTRITION DIAGNOSIS:   Severe Malnutrition related to chronic illness(end stage dementia, presumed recurrent cancer) as evidenced by severe fat depletion, severe muscle depletion.  GOAL:   Patient will meet greater than or equal to 90% of their needs  MONITOR:   PO intake, Supplement acceptance, Labs, Weight trends  REASON FOR ASSESSMENT:   Consult Assessment of nutrition requirement/status, Wound healing, Poor PO  ASSESSMENT:   84 yo female admitted with bradycardia, ARF, SIRS/hypotension. Pt with hx of breast cancer with right outer breast full thickness wound which is likely recurrence of breast cancer. Pt with 2 full thickness wounds on the middle of back, possibly related to metastatic disease. Pt with hx of end-stage dementia, HTN, CHF  Pt is alert but end-stage dementia.   Pt did not eat breakfast this AM but on visit today, pt reports she is starving and thirsty. Recorded po intake 25% at lunch.   Pt is edentulous but RN spoke with SNF who indicated that pt takes a regular consistency diet with thin liquids at baseline.   No previous weight encounters in chart. Pt is of normal weight  Nutrition Focused physical exam demonstrates severe muscle wasting and severe fat loss. Muscle wasting in ares not consistent with immobility alone. Pt likely has not been meeting nutritional needs given increased needs in setting of presume recurrent cancer, end stage dementia  Labs: Creatinine 1.60, BUN 39 Meds: reviewed  NUTRITION - FOCUSED PHYSICAL EXAM:    Most Recent Value  Orbital Region  Severe depletion  Upper Arm Region  Mild depletion  Thoracic  and Lumbar Region  No depletion  Buccal Region  Severe depletion  Temple Region  Severe depletion  Clavicle Bone Region  Severe depletion  Clavicle and Acromion Bone Region  Severe depletion  Scapular Bone Region  Severe depletion  Dorsal Hand  Unable to assess  Patellar Region  Moderate depletion  Anterior Thigh Region  Moderate depletion  Posterior Calf Region  Moderate depletion  Edema (RD Assessment)  None       Diet Order:  Diet 2 gram sodium Room service appropriate? Yes; Fluid consistency: Thin  EDUCATION NEEDS:   Not appropriate for education at this time  Skin:     Last BM:     Height:   Ht Readings from Last 1 Encounters:  03/07/18 5\' 3"  (1.6 m)    Weight:   Wt Readings from Last 1 Encounters:  03/08/18 138 lb 14.2 oz (63 kg)    Ideal Body Weight:     BMI:  Body mass index is 24.6 kg/m.  Estimated Nutritional Needs:   Kcal:  1400-1600  Protein:  77-88 g  Fluid:  >/= 1.5 L  Kerman Passey MS, RD, LDN, CNSC [phone] Pager  [phone] Weekend/On-Call Pager

## 2018-03-08 NOTE — Consult Note (Addendum)
[place] Nurse wound consult note Reason for Consult: Consult requested for back and right breast wounds.  Wound type: Right outer breast with full thickness wound; .8X.8X.2cm, white hard nodule extends upward from the wound bed, no odor, drainage, or fluctuance.  Skin surrounding the location is hard to palpation.  Appearance is consistent with probable breast tumor, not a wound.   Middle posterior back with 2 full thickness wounds of unknown etiology; location is not consistent with pressure injuries.  .2X.2X.1cm in 2 areas next to each other.  Slough in the woundbed, small amt tan drainage, no odor. Consider this could be possible metastasis site, since patient also has a breast tumor.  Dressing procedure/placement/frequency: Moist gauze to back wounds to assist with removal of nonviable tissue.  Foam dressing to protect back and right breast wounds to protect from further injury. No family present to discuss plan of care. Please re-consult if further assistance is needed.  Thank-you,  Julien Girt MSN, [place], [place], [place], [place]

## 2018-03-08 NOTE — Progress Notes (Signed)
PHARMACY - PHYSICIAN COMMUNICATION CRITICAL VALUE ALERT - BLOOD CULTURE IDENTIFICATION (BCID)  Jo Terry is an 84 y.o. female who presented to [facility] on 03/06/2018 with a chief complaint of bradycardia   Name of physician (or Provider) Contacted: Dr. [place] Lions  Current antibiotics: Cefepime for UTI  Changes to prescribed antibiotics recommended:  No changes, possible contaminant   Results for orders placed or performed during the hospital encounter of 03/06/18  Blood Culture ID Panel (Reflexed) (Collected: 03/07/2018  1:50 AM)  Result Value Ref Range   Enterococcus species NOT DETECTED NOT DETECTED   Listeria monocytogenes NOT DETECTED NOT DETECTED   Staphylococcus species DETECTED (A) NOT DETECTED   Staphylococcus aureus NOT DETECTED NOT DETECTED   Methicillin resistance NOT DETECTED NOT DETECTED   Streptococcus species NOT DETECTED NOT DETECTED   Streptococcus agalactiae NOT DETECTED NOT DETECTED   Streptococcus pneumoniae NOT DETECTED NOT DETECTED   Streptococcus pyogenes NOT DETECTED NOT DETECTED   Acinetobacter baumannii NOT DETECTED NOT DETECTED   Enterobacteriaceae species NOT DETECTED NOT DETECTED   Enterobacter cloacae complex NOT DETECTED NOT DETECTED   Escherichia coli NOT DETECTED NOT DETECTED   Klebsiella oxytoca NOT DETECTED NOT DETECTED   Klebsiella pneumoniae NOT DETECTED NOT DETECTED   Proteus species NOT DETECTED NOT DETECTED   Serratia marcescens NOT DETECTED NOT DETECTED   Haemophilus influenzae NOT DETECTED NOT DETECTED   Neisseria meningitidis NOT DETECTED NOT DETECTED   Pseudomonas aeruginosa NOT DETECTED NOT DETECTED   Candida albicans NOT DETECTED NOT DETECTED   Candida glabrata NOT DETECTED NOT DETECTED   Candida krusei NOT DETECTED NOT DETECTED   Candida parapsilosis NOT DETECTED NOT DETECTED   Candida tropicalis NOT DETECTED NOT DETECTED    [name] 03/08/2018  3:05 AM

## 2018-03-08 NOTE — Progress Notes (Signed)
Name: Jo Terry MRN: [id number] DOB: 05/09/1930    ADMISSION DATE:  03/06/2018 CONSULTATION DATE:  4/2  REFERRING MD :  Dr. Ralene Bathe EDP  CHIEF COMPLAINT:  Digoxin overdose   HISTORY OF PRESENT ILLNESS:  84 year old female with PMH significant for CHF, hypothyroid, Atrial fibrillation, and dementia. She resides in skilled nursing facility where she was noted to be somewhat altered 3/30, but returned to baseline the following day. Then 4/2 during physical therapy she became acutely altered and eventually unresponsive. EMS was called. Upon their arrival her HR was noted to be 28. She was transported to [facility] ED. It was noted that her medication list contains lopressor and digoxin. She was given atropine and digifab in ED for supratherapeutic digoxin level. HR and Blood pressure somewhat improved. Cardiology to admit. PCCM asked to consult. Of note, she presents to ED with DNR documentation.    SIGNIFICANT EVENTS  4/2 admit  STUDIES:  CT head 4/3 > no acute abnormality chronic changes    SUBJECTIVE:  Elderly female on room air in no acute distress remains on dopamine drip  VITAL SIGNS: Temp:  [97.6 F (36.4 C)-98 F (36.7 C)] 97.6 F (36.4 C) (04/04 0753) Pulse Rate:  [49-80] 63 (04/04 0715) Resp:  [11-18] 13 (04/04 0715) BP: (82-132)/(31-74) 106/47 (04/04 0753) SpO2:  [90 %-98 %] 97 % (04/04 0715) Weight:  [63 kg (138 lb 14.2 oz)] 63 kg (138 lb 14.2 oz) (04/04 0515)  PHYSICAL EXAMINATION: General: Frail cachectic female no acute distress HEENT: No JVD or lymphadenopathy is appreciated PSY: Pleasant affect Neuro: Moves all extremities x4 to command, presently confused, orientated to person, CV: Sounds are regular PULM: even/non-labored, lungs bilaterally clear IW:PYKD, non-tender, bsx4 active  Extremities: warm/dry, negative edema  Skin: Right breast lesion and lumbar lesion is noted    Recent Labs  Lab 03/06/18 1821 03/06/18 1827 03/07/18 1458 03/08/18 0358    NA 135 138 137 137  K 4.9 5.0 4.5 3.5  CL 102 102 110 112*  CO2 22  --  16* 17*  BUN 48* 46* 42* 39*  CREATININE 2.37* 2.40* 1.95* 1.60*  GLUCOSE 155* 149* 130* 112*   Recent Labs  Lab 03/06/18 1821 03/06/18 1827  HGB 13.5 13.9  HCT 41.0 41.0  WBC 13.2*  --   PLT 135*  --    Ct Head Wo Contrast  Result Date: 03/07/2018 CLINICAL DATA:  Altered mental status EXAM: CT HEAD WITHOUT CONTRAST TECHNIQUE: Contiguous axial images were obtained from the base of the skull through the vertex without intravenous contrast. COMPARISON:  None. FINDINGS: Brain: No mass lesion, intraparenchymal hemorrhage or extra-axial collection. No evidence of acute cortical infarct. There is periventricular hypoattenuation compatible with chronic microvascular disease. Vascular: No hyperdense vessel or unexpected vascular calcification. Skull: Normal visualized skull base, calvarium and extracranial soft tissues. Sinuses/Orbits: No sinus fluid levels or advanced mucosal thickening. No mastoid effusion. Normal orbits. IMPRESSION: Chronic small vessel disease without acute intracranial abnormality. Electronically Signed   By: Ulyses Jarred M.D.   On: 03/07/2018 01:19   Dg Chest Port 1 View  Result Date: 03/06/2018 CLINICAL DATA:  Chest pain EXAM: PORTABLE CHEST 1 VIEW COMPARISON:  10/21/2013 chest radiograph. FINDINGS: Pacer pads overlie the left chest. Stable cardiomediastinal silhouette with top-normal heart size. No pneumothorax. No pleural effusion. Lungs appear clear, with no acute consolidative airspace disease and no pulmonary edema. IMPRESSION: No active cardiopulmonary disease. Electronically Signed   By: Ilona Sorrel M.D.   On:  03/06/2018 18:49    ASSESSMENT / PLAN:  Bradycardia: medication related. Supratherapeutic digoxin levels, as well as scheduled metoprolol.   P: Currently on dopamine per cardiology Holding beta-blocker Hydration Palate of care has been consulted.  Hypotension P:  Currently on  dopamine Duration as needed   Acute renal failure Lab Results  Component Value Date   CREATININE 1.60 (H) 03/08/2018   CREATININE 1.95 (H) 03/07/2018   CREATININE 2.40 (H) 03/06/2018   Recent Labs  Lab 03/06/18 1827 03/07/18 1458 03/08/18 0358  K 5.0 4.5 3.5    P: Follow renal function Monitor electrolytes  SIRS -suspected UTI 03/07/2018 lactic acid 3.6 P: Blood culture 02/2018 showed staph without MRSA detected. Currently on cefepime which should cover urinary source and bacteremia with staph coag negative  History of right breast cancer 2007 with recurrence and she is opted not for further interventions.  Right breast  Lumbar area    P: History of breast cancer .Marland Kitchen  What appears to be radiation tattooing is apparent on right chest and fibrous tissue is noted underneath.  This is recurrent cancer she is opted not for further treatments. She also has ulcer on the backside which may be a metastatic processes Wound care has been called. Doubt oncology needs to be consulted. Note pictures of wounds and above note.   03/08/2018 level for pulmonary critical care to offer.  She is a full DNR and palliative care has been consulted.  I suspect she will move to the floor soon.  P CCM will be available as needed please call if needed thank you.  Richardson Landry Bricen Victory ACNP Maryanna Shape PCCM Pager [phone] till 1 pm If no answer page [phone] 03/08/2018, 8:49 AM

## 2018-03-08 NOTE — Consult Note (Signed)
Consultation Note Date: 03/08/2018   Patient Name: Jo Terry  DOB: Sep 10, 1930  MRN: [id number]  Age / Sex: 84 y.o., female  PCP: Jo Shivers, MD Referring Physician: Sanda Klein, MD  Reason for Consultation: Establishing goals of care  HPI/Patient Profile: 84 y.o. female  with past medical history of advanced dementia, HTN, atrial fibrillation, CHF, thyroid disease, recurrent right breast cancer (pt did not pursue treatment) admitted from nursing home on 03/06/2018 with bradycardia and unresponsive and found to have supratherapeutic digoxin levels, UTI, sepsis, and AKI. Requiring dopamine infusion for bradycardia.   Clinical Assessment and Goals of Care: I met today with Jo Terry who is "so tired" and complains of being hungry and cold. She is overall in good spirits but does not know where she is or why she is here but does not seem bothered by this. When I ask what she needs she tells me "a rich and good looking man" and I told her if I found one I would send him her way! She smiled.   I was able to speak with her sister/HCPOA, Jo Terry. Jo Terry shares that her sister has been doing poorly at nursing home and did not wish to work with PT/OT so this was stopped and has had poor appetite. She shares that there were plans for breast biopsy last year but they decided against this approach because they continue to be uninterested in pursuing any treatment such as chemo, radiation, or surgery. Their GOC are focused on comfort care. Jo Terry was not too keen on having her sister hospitalized but is okay with trial of dopamine since she is already here.   We discussed plan to wean dopamine and if it is not weaned than we will stop this medication Friday and Jo Terry is accepting if this means that her sister passes away she wants this to happen naturally and not prolong her life further with any measures  including medications or pressors. We discussed that after this medication is stopped we should know what to expect in the next 1-2 days. We discussed hospital death, hospice facility, as well as return to nursing facility with hospice care and Jo Terry is open to any of these that are recommended. She completely agrees with hospice approach and would not want her sister to come back to the hospital if she survives this event. Jo Terry is concerned and desires to know if she needs to pay the nursing home as payment is due the 10th. I explained that we should know our recommendation over the weekend. All questions/concerns addressed. Emotional support provided.   Primary Decision Maker HCPOA sister Jo Terry    SUMMARY OF RECOMMENDATIONS   - DNR - D/C Dobutamine Friday if not weaned by then - If survives hospitalization pursue hospice care (hospice facility vs nursing home with hospice??)  Code Status/Advance Care Planning:  DNR   Symptom Management:   Denies pain but would not hesitate to treat (regardless of vital signs) with po OxyIR or fentanyl IV given  goals for comfort.   Palliative Prophylaxis:   Aspiration, Bowel Regimen, Delirium Protocol, Frequent Pain Assessment and Turn Reposition  Additional Recommendations (Limitations, Scope, Preferences):  Minimize Medications, No Artificial Feeding, No Chemotherapy, No Radiation and No Surgical Procedures  Psycho-social/Spiritual:   Desire for further Chaplaincy support:yes  Additional Recommendations: Education on Hospice and Grief/Bereavement Support  Prognosis:   Overall prognosis very poor with likely recurrent breast cancer and declining functional status. Prognosis should become clearer over the next 1-2 days.   Discharge Planning: To Be Determined      Primary Diagnoses: Present on Admission: **None**   I have reviewed the medical record, interviewed the patient and family, and examined the patient. The  following aspects are pertinent.  Past Medical History:  Diagnosis Date  . Atrial fibrillation ([place])   . Congestive heart failure (CHF) ([facility])    Social History   Socioeconomic History  . Marital status: Widowed    Spouse name: Not on file  . Number of children: Not on file  . Years of education: Not on file  . Highest education level: Not on file  Occupational History  . Not on file  Social Needs  . Financial resource strain: Not on file  . Food insecurity:    Worry: Not on file    Inability: Not on file  . Transportation needs:    Medical: Not on file    Non-medical: Not on file  Tobacco Use  . Smoking status: Not on file  Substance and Sexual Activity  . Alcohol use: Not on file  . Drug use: Not on file  . Sexual activity: Not on file  Lifestyle  . Physical activity:    Days per week: Not on file    Minutes per session: Not on file  . Stress: Not on file  Relationships  . Social connections:    Talks on phone: Not on file    Gets together: Not on file    Attends religious service: Not on file    Active member of club or organization: Not on file    Attends meetings of clubs or organizations: Not on file    Relationship status: Not on file  Other Topics Concern  . Not on file  Social History Narrative  . Not on file   No family history on file. Scheduled Meds: . Chlorhexidine Gluconate Cloth  6 each Topical Q0600  . levothyroxine  25 mcg Oral QAC breakfast  . mupirocin ointment  1 application Nasal BID   Continuous Infusions: . sodium chloride 75 mL/hr at 03/08/18 0900  . sodium chloride 250 mL/hr at 03/08/18 0927  . ceFEPime (MAXIPIME) IV Stopped (03/08/18 0510)  . DOPamine 8 mcg/kg/min (03/08/18 0536)  . epinephrine Stopped (03/06/18 2103)  . sodium chloride     PRN Meds:.acetaminophen, ondansetron (ZOFRAN) IV No Known Allergies Review of Systems  Unable to perform ROS: Dementia    Physical Exam  Constitutional: She appears well-developed.    Thin, frail, elderly  HENT:  Head: Normocephalic and atraumatic.  Cardiovascular: Bradycardia present.  Pulmonary/Chest: No accessory muscle usage. No tachypnea. No respiratory distress.  Abdominal: Soft. Normal appearance.  Neurological: She is alert. She is disoriented.  Nursing note and vitals reviewed.   Vital Signs: BP (!) 120/44   Pulse 63   Temp 97.6 F (36.4 C) (Oral)   Resp 12   Ht _0  (1.6 m)   Wt 63 kg (138 lb 14.2 oz)   SpO2 95%   BMI  24.60 kg/m  Pain Scale: 0-10 POSS *See Group Information*: 1-Acceptable,Awake and alert Pain Score: 0-No pain   SpO2: SpO2: 95 % O2 Device:SpO2: 95 % O2 Flow Rate: .   IO: Intake/output summary:   Intake/Output Summary (Last 24 hours) at 03/08/2018 1058 Last data filed at 03/08/2018 0900 Gross per 24 hour  Intake 2517.34 ml  Output 720 ml  Net 1797.34 ml    LBM: Last BM Date: 03/08/18 Baseline Weight: Weight: 50 kg (110 lb 3.7 oz)(estimated) Most recent weight: Weight: 63 kg (138 lb 14.2 oz)     Palliative Assessment/Data: 30%     Time Total: 75 min  Greater than 50%  of this time was spent counseling and coordinating care related to the above assessment and plan.  Signed by: Vinie Sill, NP Palliative Medicine Team Pager # [phone] (M-F 8a-5p) Team Phone # [phone] (Nights/Weekends)

## 2018-03-09 DIAGNOSIS — Z515 Encounter for palliative care: Secondary | ICD-10-CM | POA: Insufficient documentation

## 2018-03-09 DIAGNOSIS — I350 Nonrheumatic aortic (valve) stenosis: Secondary | ICD-10-CM

## 2018-03-09 LAB — BASIC METABOLIC PANEL
Anion gap: 12 (ref 5–15)
BUN: 28 mg/dL — AB (ref 6–20)
CHLORIDE: 110 mmol/L (ref 101–111)
CO2: 15 mmol/L — ABNORMAL LOW (ref 22–32)
Calcium: 8.4 mg/dL — ABNORMAL LOW (ref 8.9–10.3)
Creatinine, Ser: 0.98 mg/dL (ref 0.44–1.00)
GFR calc Af Amer: 59 mL/min — ABNORMAL LOW (ref >60)
GFR calc non Af Amer: 51 mL/min — ABNORMAL LOW (ref >60)
Glucose, Bld: 115 mg/dL — ABNORMAL HIGH (ref 65–99)
POTASSIUM: 3.2 mmol/L — AB (ref 3.5–5.1)
SODIUM: 137 mmol/L (ref 135–145)

## 2018-03-09 NOTE — Progress Notes (Addendum)
Pharmacy Antibiotic Note  Jo Terry is a 84 y.o. female admitted on 03/06/2018 with suspected UTI.  Pharmacy has been consulted for Cefepime dosing. WBC 13.2.   Afebrile overnight, no urine culture sent. Blood cultures 1/2 coag neg staph likely contaminant. Continue cefepime for now (dose reduced) and follow plan.   Plan: Cefepime 500mg  IV q24h Trend WBC, temp, renal function  Pharmacy to sign off and follow peripherally  Height: 5\' 3"  (160 cm) Weight: 138 lb 14.2 oz (63 kg) IBW/kg (Calculated) : 52.4  Temp (24hrs), Avg:97.9 F (36.6 C), Min:97.6 F (36.4 C), Max:98.3 F (36.8 C)  Recent Labs  Lab 03/06/18 1821 03/06/18 1827 03/07/18 0021 03/07/18 0218 03/07/18 0558 03/07/18 1458 03/08/18 0358  WBC 13.2*  --   --   --   --   --   --   CREATININE 2.37* 2.40*  --   --   --  1.95* 1.60*  LATICACIDVEN  --   --  3.9* 3.2* 3.6* 2.9*  --     Estimated Creatinine Clearance: 23 mL/min (A) (by C-G formula based on SCr of 1.6 mg/dL (H)).    No Known Allergies  Erin Hearing PharmD., BCPS Clinical Pharmacist 03/09/2018 8:48 AM

## 2018-03-09 NOTE — Progress Notes (Signed)
Daily Progress Note   Patient Name: Jo Terry       Date: 03/09/2018 DOB: [birth year]  Age: 84 y.o. MRN#: [id number] Attending Physician: Sanda Klein, MD Primary Care Physician: Maryella Shivers, MD Admit Date: 03/06/2018  Reason for Consultation/Follow-up:GOC, disposition, psychosocial support  Subjective: Pt seen chart reviewed.  Patient is alert today but does not remember why she is in the hospital.  She tells me her back hurts and that she is hungry  Length of Stay: 2  Current Medications: Scheduled Meds:  . ceFEPime (MAXIPIME) IV  500 mg Intravenous Q24H  . feeding supplement (ENSURE ENLIVE)  237 mL Oral BID BM  . levothyroxine  25 mcg Oral QAC breakfast  . mupirocin ointment  1 application Nasal BID    Continuous Infusions: . sodium chloride Stopped (03/09/18 0911)  . sodium chloride      PRN Meds: acetaminophen, ondansetron (ZOFRAN) IV  Physical Exam  Constitutional: She appears well-developed.  Neck: Normal range of motion.  Cardiovascular:  HR100  Pulmonary/Chest:  Mild increased work of breathing at rest  Abdominal: Soft.  Neurological: She is alert.  Oriented to self and that she is in the hospital. Able to tell me she is hungry and that her back hurts    Skin: Skin is warm and dry. There is pallor.  jaundice  Psychiatric: She has a normal mood and affect. Her behavior is normal.  Nursing note and vitals reviewed.           Vital Signs: BP (!) 68/50   Pulse (!) 104   Temp 97.6 F (36.4 C) (Oral)   Resp 17   Ht 5\' 3"  (1.6 m)   Wt 63 kg (138 lb 14.2 oz)   SpO2 96%   BMI 24.60 kg/m  SpO2: SpO2: 96 % O2 Device: O2 Device: Room Air O2 Flow Rate:    Intake/output summary:   Intake/Output Summary (Last 24 hours) at 03/09/2018 1045 Last data  filed at 03/09/2018 0745 Gross per 24 hour  Intake 3238.45 ml  Output 840 ml  Net 2398.45 ml   LBM: Last BM Date: 03/08/18 Baseline Weight: Weight: 50 kg (110 lb 3.7 oz)(estimated) Most recent weight: Weight: 63 kg (138 lb 14.2 oz)       Palliative Assessment/Data:    Flowsheet Rows  Most Recent Value  Intake Tab  Referral Department  Cardiology  Palliative Care Primary Diagnosis  Cancer  Date Notified  03/08/18  Palliative Care Type  New Palliative care  Reason for referral  Clarify Goals of Care  Date of Admission  03/06/18  Date first seen by Palliative Care  03/08/18  # of days Palliative referral response time  0 Day(s)  # of days IP prior to Palliative referral  2  Clinical Assessment  Psychosocial & Spiritual Assessment  Palliative Care Outcomes      Patient Active Problem List   Diagnosis Date Noted  . AKI (acute kidney injury) ([place])   . Recurrent cancer of right breast ([place])   . Goals of care, counseling/discussion   . Palliative care encounter   . Symptomatic bradycardia 03/07/2018  . Pressure injury of skin 03/07/2018  . Acute cystitis with hematuria     Palliative Care Assessment & Plan   Patient Profile: 84 y.o. female  with past medical history of advanced dementia, HTN, atrial fibrillation, CHF, thyroid disease, recurrent right breast cancer (pt did not pursue treatment) admitted from nursing home on 03/06/2018 with bradycardia and unresponsive and found to have supratherapeutic digoxin levels, UTI, sepsis, and AKI. Requiring dopamine infusion for bradycardia.   Consult ordered for goals of care  03/09/18: Patient now off dobutamine and epinephrine as well as antibiotics.   Assessment: Patient seen.  Spoke to patient's sister, and Nicki Reaper, who is healthcare power of attorney.  Plan at this point is continue with comfort based approach with patient to return to the skilled nursing facility in [place], [facility] with hospice  support.  Discussed with sister that patient may meet residential hospice criteria in [facility].  Sister states plan is to return to SNF that they have hospice in the facility there and patient knows the providers.  Recommendations/Plan:  Social work order expanded to include hospice referral in [facility] facility  Goals of Care and Additional Recommendations:  Limitations on Scope of Treatment: Full Comfort Care  Code Status:    Code Status Orders  (From admission, onward)        Start     Ordered   03/07/18 0010  Do not attempt resuscitation (DNR)  Continuous    Question Answer Comment  In the event of cardiac or respiratory ARREST Do not call a "code blue"   In the event of cardiac or respiratory ARREST Do not perform Intubation, CPR, defibrillation or ACLS   In the event of cardiac or respiratory ARREST Use medication by any route, position, wound care, and other measures to relive pain and suffering. May use oxygen, suction and manual treatment of airway obstruction as needed for comfort.      03/07/18 0009    Code Status History    This patient has a current code status but no historical code status.    Advance Directive Documentation     Most Recent Value  Type of Advance Directive  Healthcare Power of Attorney, Living will, Out of facility DNR (pink MOST or yellow form)  Pre-existing out of facility DNR order (yellow form or pink MOST form)  -  "MOST" Form in Place?  -       Prognosis:   < 4 weeks in the setting of dementia, severe aortic stenosis, moderate diastolic heart failure, chronic kidney disease stage III, recurrent breast cancer, hypertension.  Patient is short of breath with any exertion.  Discharge Planning:  Skilled Nursing  Facility with Hospice    Thank you for allowing the Palliative Medicine Team to assist in the care of this patient.   Time In: 0900 Time Out: 0940 Total Time 40 min Prolonged Time Billed  no         Greater than 50%  of this time was spent counseling and coordinating care related to the above assessment and plan.  Dory Horn, NP  Please contact Palliative Medicine Team phone at [phone] for questions and concerns.

## 2018-03-09 NOTE — Social Work (Signed)
CSW has received call from [facility]. Pt is a resident there and they are able to take her back with hospice services, they state that since pt has Medicaid there should be no barriers to her return. They are able to take her as early as tomorrow if medically appropriate.   CSW paged MD with this information; CSW called pt sister Lelon Frohlich from number listed on facesheet, no answer-HIPPA compliant message left.   CSW will leave handoff for weekend coverage with plans.  Alexander Mt, [place] Work [phone]

## 2018-03-09 NOTE — NC FL2 (Signed)
[place] LEVEL OF CARE SCREENING TOOL     IDENTIFICATION  Patient Name: Jo Terry Birthdate: 02-Mar-1930 Sex: female Admission Date (Current Location): 03/06/2018  [facility] and [place] Number:  [employer] and Address:  [facility], [place] [address] [place], [place] [address]      Provider Number: [id number]  Attending Physician Name and Address:  Sanda Klein, MD  Relative Name and Phone Number:   [name]; sister; [phone]    Current Level of Care: Hospital Recommended Level of Care: [place] Prior Approval Number:    Date Approved/Denied:   PASRR Number:   [id number]   Discharge Plan: SNF    Current Diagnoses: Patient Active Problem List   Diagnosis Date Noted  . Palliative care by specialist   . AKI (acute kidney injury) ([place])   . Recurrent cancer of right breast ([place])   . Goals of care, counseling/discussion   . Palliative care encounter   . Symptomatic bradycardia 03/07/2018  . Pressure injury of skin 03/07/2018  . Acute cystitis with hematuria     Orientation RESPIRATION BLADDER Height & Weight     Self, Place  Normal Incontinent, External catheter Weight: 138 lb 14.2 oz (63 kg) Height:  5\' 3"  (160 cm)  BEHAVIORAL SYMPTOMS/MOOD NEUROLOGICAL BOWEL NUTRITION STATUS      Incontinent Diet(see discharge summary)  AMBULATORY STATUS COMMUNICATION OF NEEDS Skin   Total Care Verbally PU Stage and Appropriate Care, Other (Comment)(wounds on R breast & mid back) PU Stage 1 Dressing: (sacrum; bilateral heel; left ankle)                     Personal Care Assistance Level of Assistance  Bathing, Feeding, Dressing Bathing Assistance: Maximum assistance Feeding assistance: Maximum assistance Dressing Assistance: Maximum assistance     Functional Limitations Info  Sight, Hearing, Speech Sight Info: Adequate Hearing Info: Adequate Speech Info: Adequate    SPECIAL CARE FACTORS FREQUENCY                        Contractures Contractures Info: Not present    Additional Factors Info  Code Status, Allergies, Psychotropic, Isolation Precautions Code Status Info: DNR Allergies Info: No Known Allergies Psychotropic Info: No Known Allergies   Isolation Precautions Info: MRSA     Current Medications (03/09/2018):  This is the current hospital active medication list Current Facility-Administered Medications  Medication Dose Route Frequency Provider Last Rate Last Dose  . 0.9 %  sodium chloride infusion   Intravenous Continuous Hammonds, Sharyn Blitz, MD   Stopped at 03/09/18 [phone]  . acetaminophen (TYLENOL) tablet 650 mg  650 mg Oral Q4H PRN Fudim, Marat, MD      . ceFEPIme (MAXIPIME) 500 mg in dextrose 5 % 50 mL IVPB  500 mg Intravenous Q24H Lyndee Leo, RPH   500 mg at 03/09/18 0755  . feeding supplement (ENSURE ENLIVE) (ENSURE ENLIVE) liquid 237 mL  237 mL Oral BID BM Croitoru, Mihai, MD   237 mL at 03/08/18 1538  . levothyroxine (SYNTHROID, LEVOTHROID) tablet 25 mcg  25 mcg Oral QAC breakfast Cristina Gong, MD   25 mcg at 03/09/18 0755  . mupirocin ointment (BACTROBAN) 2 % 1 application  1 application Nasal BID Croitoru, Mihai, MD   1 application at 02/58/52 2300  . ondansetron (ZOFRAN) injection 4 mg  4 mg Intravenous Q6H PRN Fudim, Liborio Nixon, MD      . sodium  chloride 0.9 % bolus 500 mL  500 mL Intravenous Once Hammonds, Sharyn Blitz, MD         Discharge Medications: Please see discharge summary for a list of discharge medications.  Relevant Imaging Results:  Relevant Lab Results:   Additional Information SS# [id number]; requesting to return with hospice services  Alexander Mt, LCSWA

## 2018-03-09 NOTE — Progress Notes (Signed)
Attempted to visit with this patient this morning. Patient asleep.  No other family in room with patient at the current time.  Will continue to follow up,    03/09/18 1420  Clinical Encounter Type  Visited With Patient  Visit Type Initial;Spiritual support

## 2018-03-09 NOTE — Plan of Care (Signed)
  Problem: Clinical Measurements: Goal: Ability to maintain clinical measurements within normal limits will improve Outcome: Not Progressing Goal: Will remain free from infection Outcome: Not Progressing   

## 2018-03-09 NOTE — Progress Notes (Signed)
Progress Note  Patient Name: Jo Terry Date of Encounter: 03/09/2018  Primary Cardiologist: No primary care provider on file.   Subjective   Alert and conversant, as always confused, but cooperative. HR is now in the 90s (probably sinus with 1st deg AV block), but kept on dopamine for low BP. Echo shows critical AS.   - Left ventricle: The cavity size was normal. Wall thickness was   increased in a pattern of mild LVH. Systolic function was normal.   The estimated ejection fraction was in the range of 60% to 65%.   Features are consistent with a pseudonormal left ventricular   filling pattern, with concomitant abnormal relaxation and   increased filling pressure (grade 2 diastolic dysfunction).   E/medial e&' > 15, suggesting LV end diastolic pressure at least   20 mmHg. - Aortic valve: Trileaflet; severely calcified leaflets. There was   severe stenosis. Peak gradient (S): 99 mm Hg. Valve area (VTI):   0.64 cm^2. - Mitral valve: Mildly calcified annulus. Mildly calcified leaflets   . There was mild regurgitation. - Left atrium: The atrium was mildly dilated. - Right ventricle: The cavity size was normal. Systolic function   was normal. - Pulmonary arteries: No complete TR doppler jet so unable to   estimate PA systolic pressure. - Inferior vena cava: The vessel was normal in size. The   respirophasic diameter changes were in the normal range (>= 50%),   consistent with normal central venous pressure.  Impressions:  - Normal LV size with mild LV hypertrophy, EF 60-65%. Moderate   diastolic dysfunction with evidence for elevated LV filling   pressure. Normal RV size and systolic function. Severe aortic   stenosis. Mild mitral regurgitation.  Inpatient Medications    Scheduled Meds: . ceFEPime (MAXIPIME) IV  500 mg Intravenous Q24H  . feeding supplement (ENSURE ENLIVE)  237 mL Oral BID BM  . levothyroxine  25 mcg Oral QAC breakfast  . mupirocin ointment  1  application Nasal BID   Continuous Infusions: . sodium chloride 75 mL/hr at 03/09/18 0400  . DOPamine 9 mcg/kg/min (03/09/18 0649)  . epinephrine Stopped (03/06/18 2103)  . sodium chloride     PRN Meds: acetaminophen, ondansetron (ZOFRAN) IV   Vital Signs    Vitals:   03/09/18 0500 03/09/18 0700 03/09/18 0733 03/09/18 0800  BP: (!) 118/47 (!) 110/48  (!) 92/51  Pulse: 91 66  93  Resp: 20 18  14   Temp:   97.6 F (36.4 C)   TempSrc:   Oral   SpO2: 98% 96%  97%  Weight:      Height:        Intake/Output Summary (Last 24 hours) at 03/09/2018 0857 Last data filed at 03/09/2018 0500 Gross per 24 hour  Intake 3728.45 ml  Output 765 ml  Net 2963.45 ml   Filed Weights   03/07/18 0010 03/07/18 0115 03/08/18 0515  Weight: 148 lb 13 oz (67.5 kg) 140 lb 14 oz (63.9 kg) 138 lb 14.2 oz (63 kg)    Telemetry    Mostly sinus rhythm, possibly some accelerated junctional rhythm - Personally Reviewed  ECG    No new tracing - Personally Reviewed  Physical Exam  Alert, calm, disoriented GEN: No acute distress.   Neck: No JVD Cardiac: RRR, faint systolic murmur - sounds holosystolic, but heard best in aortic focus, no diastolic murmurs, rubs, or gallops.  Respiratory: Clear to auscultation bilaterally. GI: Soft, nontender, non-distended  MS: No edema; No  deformity. Neuro:  Nonfocal  Psych: Normal affect   Labs    Chemistry Recent Labs  Lab 03/06/18 1821  03/07/18 1458 03/08/18 0358 03/09/18 0612  NA 135   < > 137 137 137  K 4.9   < > 4.5 3.5 3.2*  CL 102   < > 110 112* 110  CO2 22  --  16* 17* 15*  GLUCOSE 155*   < > 130* 112* 115*  BUN 48*   < > 42* 39* 28*  CREATININE 2.37*   < > 1.95* 1.60* 0.98  CALCIUM 9.3  --  8.6* 8.4* 8.4*  PROT 6.4*  --   --   --   --   ALBUMIN 3.0*  --   --   --   --   AST 62*  --   --   --   --   ALT 25  --   --   --   --   ALKPHOS 80  --   --   --   --   BILITOT 0.7  --   --   --   --   GFRNONAA 18*  --  22* 28* 51*  GFRAA 20*  --  26*  33* 59*  ANIONGAP 11  --  11 8 12    < > = values in this interval not displayed.     Hematology Recent Labs  Lab 03/06/18 1821 03/06/18 1827  WBC 13.2*  --   RBC 4.26  --   HGB 13.5 13.9  HCT 41.0 41.0  MCV 96.2  --   MCH 31.7  --   MCHC 32.9  --   RDW 15.0  --   PLT 135*  --     Cardiac Enzymes Recent Labs  Lab 03/07/18 0149 03/07/18 1458  TROPONINI 0.23* 0.17*    Recent Labs  Lab 03/06/18 1825  TROPIPOC 0.30*     BNPNo results for input(s): BNP, PROBNP in the last 168 hours.   DDimer No results for input(s): DDIMER in the last 168 hours.   Radiology    No results found.  Cardiac Studies    - Left ventricle: The cavity size was normal. Wall thickness was   increased in a pattern of mild LVH. Systolic function was normal.   The estimated ejection fraction was in the range of 60% to 65%.   Features are consistent with a pseudonormal left ventricular   filling pattern, with concomitant abnormal relaxation and   increased filling pressure (grade 2 diastolic dysfunction).   E/medial e&' > 15, suggesting LV end diastolic pressure at least   20 mmHg. - Aortic valve: Trileaflet; severely calcified leaflets. There was   severe stenosis. Peak gradient (S): 99 mm Hg. Valve area (VTI):   0.64 cm^2. - Mitral valve: Mildly calcified annulus. Mildly calcified leaflets   . There was mild regurgitation. - Left atrium: The atrium was mildly dilated. - Right ventricle: The cavity size was normal. Systolic function   was normal. - Pulmonary arteries: No complete TR doppler jet so unable to   estimate PA systolic pressure. - Inferior vena cava: The vessel was normal in size. The   respirophasic diameter changes were in the normal range (>= 50%),   consistent with normal central venous pressure.  Impressions:  - Normal LV size with mild LV hypertrophy, EF 60-65%. Moderate   diastolic dysfunction with evidence for elevated LV filling   pressure. Normal RV size and  systolic function. Severe  aortic   stenosis. Mild mitral regurgitation.  Patient Profile     84 y.o. female with dementia, critical AS, diastolic HF, admitted for severe bradycardia and low output state, now resolved. Remains hypotensive.  Assessment & Plan    She is not a candidate for AVR - either surgical or percutaneous. Prognosis is dismal, with survival possibly measured in days or weeks. Full comfort care is appropriate, including hospice. Discussed that with her sister and medical POA [name], who understands and agrees.  Reviewed with our Palliative Care team - there are administrative issues related to her return to the current SNF if she has hospice status. Searching for alternative solutions. Will wean off Dopamine and transfer out of ICU.  For questions or updates, please contact [place] Please consult [email or web address] for contact info under Cardiology/STEMI.      Signed, Sanda Klein, MD  03/09/2018, 8:57 AM

## 2018-03-10 LAB — BASIC METABOLIC PANEL
ANION GAP: 8 (ref 5–15)
BUN: 24 mg/dL — AB (ref 6–20)
CHLORIDE: 111 mmol/L (ref 101–111)
CO2: 17 mmol/L — AB (ref 22–32)
Calcium: 8.4 mg/dL — ABNORMAL LOW (ref 8.9–10.3)
Creatinine, Ser: 0.91 mg/dL (ref 0.44–1.00)
GFR calc Af Amer: >60 mL/min (ref >60)
GFR, EST NON AFRICAN AMERICAN: 56 mL/min — AB (ref >60)
GLUCOSE: 81 mg/dL (ref 65–99)
POTASSIUM: 3.4 mmol/L — AB (ref 3.5–5.1)
Sodium: 136 mmol/L (ref 135–145)

## 2018-03-10 LAB — CULTURE, BLOOD (ROUTINE X 2): Special Requests: ADEQUATE

## 2018-03-10 NOTE — Progress Notes (Signed)
Progress Note  Patient Name: Jo Terry Date of Encounter: 03/10/2018  Primary Cardiologist:  Croitoru  Subjective   Jo Terry is a 84 y.o. female with a history of end-stage dementia, hypertension, atrial fibrillation CHF, thyroid disease.  Resents from nursing home where she lives since 2014 with new onset bradycardia  She has been found to have severe aortic stenosis with a peak aortic valve gradient of 99 mmHg.  Not a candidate for standard aortic valve replacement T AVR.  Hospice and palliative care have been consulted.  Inpatient Medications    Scheduled Meds: . ceFEPime (MAXIPIME) IV  500 mg Intravenous Q24H  . feeding supplement (ENSURE ENLIVE)  237 mL Oral BID BM  . levothyroxine  25 mcg Oral QAC breakfast  . mupirocin ointment  1 application Nasal BID   Continuous Infusions: . sodium chloride Stopped (03/09/18 0911)  . sodium chloride     PRN Meds: acetaminophen, ondansetron (ZOFRAN) IV   Vital Signs    Vitals:   03/09/18 1300 03/09/18 1441 03/10/18 0500 03/10/18 0834  BP: (!) 87/42 (!) 88/46 (!) 99/55 109/82  Pulse: 66 68 87 86  Resp: 12 15 16 16   Temp:  97.6 F (36.4 C) 98.2 F (36.8 C) 98.2 F (36.8 C)  TempSrc:  Oral Oral Oral  SpO2: 99% 98% 98% 100%  Weight:      Height:        Intake/Output Summary (Last 24 hours) at 03/10/2018 1142 Last data filed at 03/10/2018 1037 Gross per 24 hour  Intake 236 ml  Output 475 ml  Net -239 ml   Filed Weights   03/07/18 0010 03/07/18 0115 03/08/18 0515  Weight: 148 lb 13 oz (67.5 kg) 140 lb 14 oz (63.9 kg) 138 lb 14.2 oz (63 kg)    Telemetry    Not on tele   ECG    NSR   Physical Exam   GEN:  Elderly frail female, no acute distress.  Somewhat demented. Neck: No JVD Cardiac:  Rate S1-S2.  She has a 3/6 systolic murmur at the upper sternal border.Marland Kitchen  Respiratory: Clear to auscultation bilaterally. GI: Soft, nontender, non-distended  MS: No edema; No deformity. Neuro:  Nonfocal , periodic  conversation.  Somewhat demented. Psych: Normal affect   Labs    Chemistry Recent Labs  Lab 03/06/18 1821  03/08/18 0358 03/09/18 0612 03/10/18 0533  NA 135   < > 137 137 136  K 4.9   < > 3.5 3.2* 3.4*  CL 102   < > 112* 110 111  CO2 22   < > 17* 15* 17*  GLUCOSE 155*   < > 112* 115* 81  BUN 48*   < > 39* 28* 24*  CREATININE 2.37*   < > 1.60* 0.98 0.91  CALCIUM 9.3   < > 8.4* 8.4* 8.4*  PROT 6.4*  --   --   --   --   ALBUMIN 3.0*  --   --   --   --   AST 62*  --   --   --   --   ALT 25  --   --   --   --   ALKPHOS 80  --   --   --   --   BILITOT 0.7  --   --   --   --   GFRNONAA 18*   < > 28* 51* 56*  GFRAA 20*   < > 33* 59* >60  ANIONGAP  11   < > 8 12 8    < > = values in this interval not displayed.     Hematology Recent Labs  Lab 03/06/18 1821 03/06/18 1827  WBC 13.2*  --   RBC 4.26  --   HGB 13.5 13.9  HCT 41.0 41.0  MCV 96.2  --   MCH 31.7  --   MCHC 32.9  --   RDW 15.0  --   PLT 135*  --     Cardiac Enzymes Recent Labs  Lab 03/07/18 0149 03/07/18 1458  TROPONINI 0.23* 0.17*    Recent Labs  Lab 03/06/18 1825  TROPIPOC 0.30*     BNPNo results for input(s): BNP, PROBNP in the last 168 hours.   DDimer No results for input(s): DDIMER in the last 168 hours.   Radiology    No results found.  Cardiac Studies     Patient Profile     84 y.o. female with dementia, critical aortic stenosis, diastolic congestive heart failure.  Assessment & Plan    1.  Critical aortic stenosis: Patient is not a candidate for standard aortic valve replacement for T AVR.  Refer to palliative care.  Continue with current course of medications.  Goals are to keep her comfortable at this point.  2.  Bradycardia: This is resolved.  For questions or updates, please contact [place] Please consult [email or web address] for contact info under Cardiology/STEMI.      Signed, Mertie Moores, MD  03/10/2018, 11:42 AM

## 2018-03-11 ENCOUNTER — Encounter (HOSPITAL_COMMUNITY): Payer: Self-pay | Admitting: Nurse Practitioner

## 2018-03-11 DIAGNOSIS — E861 Hypovolemia: Secondary | ICD-10-CM

## 2018-03-11 DIAGNOSIS — I350 Nonrheumatic aortic (valve) stenosis: Secondary | ICD-10-CM

## 2018-03-11 DIAGNOSIS — I959 Hypotension, unspecified: Secondary | ICD-10-CM

## 2018-03-11 DIAGNOSIS — I5189 Other ill-defined heart diseases: Secondary | ICD-10-CM | POA: Insufficient documentation

## 2018-03-11 DIAGNOSIS — R55 Syncope and collapse: Secondary | ICD-10-CM

## 2018-03-11 DIAGNOSIS — I5032 Chronic diastolic (congestive) heart failure: Secondary | ICD-10-CM

## 2018-03-11 DIAGNOSIS — T460X1A Poisoning by cardiac-stimulant glycosides and drugs of similar action, accidental (unintentional), initial encounter: Secondary | ICD-10-CM

## 2018-03-11 DIAGNOSIS — F0390 Unspecified dementia without behavioral disturbance: Secondary | ICD-10-CM

## 2018-03-11 MED ORDER — MUPIROCIN 2 % EX OINT: 1.0000 "application " | TOPICAL_OINTMENT | Freq: Two times a day (BID) | CUTANEOUS | 0 refills | Status: AC

## 2018-03-11 NOTE — Clinical Social Work Placement (Signed)
   CLINICAL SOCIAL WORK PLACEMENT  NOTE  Date:  03/11/2018  Patient Details  Name: Jo Terry MRN: [id number] Date of Birth: [birth year]  Clinical Social Work is seeking post-discharge placement for this patient at the [place] level of care (*CSW will initial, date and re-position this form in  chart as items are completed):  Yes   Patient/family provided with [place] Work Department's list of facilities offering this level of care within the geographic area requested by the patient (or if unable, by the patient's family).  Yes   Patient/family informed of their freedom to choose among providers that offer the needed level of care, that participate in Medicare, Medicaid or managed care program needed by the patient, have an available bed and are willing to accept the patient.  Yes   Patient/family informed of 's ownership interest in [facility] and [facility], as well as of the fact that they are under no obligation to receive care at these facilities.  PASRR submitted to EDS on       PASRR number received on       Existing PASRR number confirmed on 03/09/18     FL2 transmitted to all facilities in geographic area requested by pt/family on 03/08/18     FL2 transmitted to all facilities within larger geographic area on       Patient informed that his/her managed care company has contracts with or will negotiate with certain facilities, including the following:        Yes   Patient/family informed of bed offers received.  Patient chooses bed at [facility]     Physician recommends and patient chooses bed at      Patient to be transferred to [facility] on 03/11/18.  Patient to be transferred to facility by Ambulance     Patient family notified on 03/11/18 of transfer.  Name of family member notified:  Patient sister - [name]     PHYSICIAN Please prepare priority discharge summary,  including medications     Additional Comment:    Barbette Or, [place]

## 2018-03-11 NOTE — Progress Notes (Signed)
Progress Note   Subjective   Doing well today, the patient denies CP or SOB. She has some nausea.  Hurts "all over"  Inpatient Medications    Scheduled Meds: . ceFEPime (MAXIPIME) IV  500 mg Intravenous Q24H  . feeding supplement (ENSURE ENLIVE)  237 mL Oral BID BM  . levothyroxine  25 mcg Oral QAC breakfast  . mupirocin ointment  1 application Nasal BID   Continuous Infusions: . sodium chloride Stopped (03/09/18 0911)  . sodium chloride     PRN Meds: acetaminophen, ondansetron (ZOFRAN) IV   Vital Signs    Vitals:   03/10/18 0500 03/10/18 0834 03/10/18 2231 03/11/18 0619  BP: (!) 99/55 109/82 (!) 124/54 99/64  Pulse: 87 86 92 92  Resp: 16 16 16    Temp: 98.2 F (36.8 C) 98.2 F (36.8 C) 98.2 F (36.8 C) 98.4 F (36.9 C)  TempSrc: Oral Oral Oral Oral  SpO2: 98% 100% 99% 100%  Weight:      Height:        Intake/Output Summary (Last 24 hours) at 03/11/2018 1018 Last data filed at 03/10/2018 2232 Gross per 24 hour  Intake 716 ml  Output 400 ml  Net 316 ml   Filed Weights   03/07/18 0010 03/07/18 0115 03/08/18 0515  Weight: 148 lb 13 oz (67.5 kg) 140 lb 14 oz (63.9 kg) 138 lb 14.2 oz (63 kg)     Physical Exam  GEN- The patient is elderly and frail appearing, alert Head- normocephalic, atraumatic Eyes-  Sclera clear, conjunctiva pink Ears- hearing intact Oropharynx- clear Neck- supple, Lungs- Clear to ausculation bilaterally, normal work of breathing Heart- Regular rate and rhythm, 3/6 SEM LUSB late peaking GI- soft, NT, ND, + BS Extremities- no clubbing, cyanosis, or edema  MS- diffuse atrophy   Labs    Chemistry Recent Labs  Lab 03/06/18 1821  03/08/18 0358 03/09/18 0612 03/10/18 0533  NA 135   < > 137 137 136  K 4.9   < > 3.5 3.2* 3.4*  CL 102   < > 112* 110 111  CO2 22   < > 17* 15* 17*  GLUCOSE 155*   < > 112* 115* 81  BUN 48*   < > 39* 28* 24*  CREATININE 2.37*   < > 1.60* 0.98 0.91  CALCIUM 9.3   < > 8.4* 8.4* 8.4*  PROT 6.4*  --   --    --   --   ALBUMIN 3.0*  --   --   --   --   AST 62*  --   --   --   --   ALT 25  --   --   --   --   ALKPHOS 80  --   --   --   --   BILITOT 0.7  --   --   --   --   GFRNONAA 18*   < > 28* 51* 56*  GFRAA 20*   < > 33* 59* >60  ANIONGAP 11   < > 8 12 8    < > = values in this interval not displayed.     Hematology Recent Labs  Lab 03/06/18 1821 03/06/18 1827  WBC 13.2*  --   RBC 4.26  --   HGB 13.5 13.9  HCT 41.0 41.0  MCV 96.2  --   MCH 31.7  --   MCHC 32.9  --   RDW 15.0  --   PLT 135*  --  Cardiac Enzymes Recent Labs  Lab 03/07/18 0149 03/07/18 1458  TROPONINI 0.23* 0.17*    Recent Labs  Lab 03/06/18 1825  TROPIPOC 0.30*    Patient Profile     84 y.o. female with dementia, critical aortic stenosis, diastolic congestive heart failure.    Assessment & Plan    1.  Critical aortic stenosis Not a candidate for intervention Palliative plans are noted Comfort care going forward  2. Bradycardia Resolved  3. Nausea Supportive measures  Will plan for discharge today  Thompson Grayer MD, [facility] 03/11/2018 10:18 AM

## 2018-03-11 NOTE — Progress Notes (Signed)
Called report to [place], Therapist, sports at nursing home. Await transport.

## 2018-03-11 NOTE — Discharge Summary (Signed)
Discharge Summary    Patient ID: Jo Terry,  MRN: [id number], DOB/AGE: [birth year] 82 y.o.  Admit date: 03/06/2018 Discharge date: 03/11/2018  Primary Care Provider: Maryella Shivers Primary Cardiologist: Jerilynn Mages. Croitoru, MD   Discharge Diagnoses    Principal Problem:   Syncope Active Problems:   Symptomatic bradycardia   Critical aortic valve stenosis   Dementia   Digoxin toxicity   AKI (acute kidney injury) (HCC)   Chronic diastolic CHF (congestive heart failure) (HCC)   Hypotension   Pressure injury of skin   Acute cystitis with hematuria   Recurrent cancer of right breast (HCC)   Hypovolemia   Allergies No Known Allergies  Diagnostic Studies/Procedures    2D Echocardiogram 4.4.2019  Study Conclusions   - Left ventricle: The cavity size was normal. Wall thickness was   increased in a pattern of mild LVH. Systolic function was normal.   The estimated ejection fraction was in the range of 60% to 65%.   Features are consistent with a pseudonormal left ventricular   filling pattern, with concomitant abnormal relaxation and   increased filling pressure (grade 2 diastolic dysfunction).   E/medial e&' > 15, suggesting LV end diastolic pressure at least   20 mmHg. - Aortic valve: Trileaflet; severely calcified leaflets. There was   severe stenosis. Peak gradient (S): 99 mm Hg. Valve area (VTI):   0.64 cm^2. - Mitral valve: Mildly calcified annulus. Mildly calcified leaflets   . There was mild regurgitation. - Left atrium: The atrium was mildly dilated. - Right ventricle: The cavity size was normal. Systolic function   was normal. - Pulmonary arteries: No complete TR doppler jet so unable to   estimate PA systolic pressure. - Inferior vena cava: The vessel was normal in size. The   respirophasic diameter changes were in the normal range (>= 50%),   consistent with normal central venous pressure.   Impressions:   - Normal LV size with mild LV hypertrophy, EF  60-65%. Moderate   diastolic dysfunction with evidence for elevated LV filling   pressure. Normal RV size and systolic function. Severe aortic   stenosis. Mild mitral regurgitation. _____________   History of Present Illness     84 year old female with a history of end-stage dementia, hypertension, reported history of paroxysmal atrial fibrillation, diastolic heart failure, and thyroid disease.  She is lived in a nursing home since 2014 and was reportedly in her usual state of health until March 30, when she was noted to have some alteration of mental status with subsequent return to baseline.  On April 2, she was working with physical therapy and developed acute alteration of mental status followed by syncope.  EMS was called and she was found to be bradycardic with a heart rate of 28.  She was taken to the  [facility] emergency department where she was hypotensive.  Medication list showed that she was on oral Lopressor and digoxin.  Digoxin level returned at 2.4 and she was treated with DigiFab and atropine.  Lactate was elevated, though procalcitonin was only 0.21.  Creatinine was elevated at 2.40 and potassium was 5.0.  She was treated with IV fluids and IV antibiotics with concern for sepsis.  She was placed on dopamine infusion for bradycardia and hypotension and admitted for further evaluation.  Hospital Course     Consultants: Critical care medicine; palliative care  Following admission, heart rate stabilized the blood pressures remain soft and intravenous dopamine was continued.  2D echocardiogram was performed  and showed critical aortic stenosis with a peak gradient of 99 mmHg and a valve area of 0.64 cm.  With IV fluids, vasopressor support, and withholding of home dose of Lasix she became more hemodynamically stable and renal function improved.  She was treated with cefepime for presumed sepsis.  She did have one blood culture that was positive for Staphylococcus species and was felt to be  contaminant as follow-up blood cultures have shown no growth.  In the setting of critical aortic stenosis and advanced dementia, patient was not felt to be a candidate for SAVR or TAVR.  Family was informed and palliative care was consulted.  Following palliative care consultation, patient was made a DNR and arrangements were made for initiation of hospice care through her skilled nursing facility.  Ms. Puskas has been stable off of dopamine therapy without recurrence of hypotension or bradycardia.  She has had no recurrence of presyncope or syncope.  Overall prognosis remains poor.  She will be discharged to skilled nursing facility today. _____________  Discharge Vitals Blood pressure 99/64, pulse 92, temperature 98.4 F (36.9 C), temperature source Oral, resp. rate 16, height 5\' 3"  (1.6 m), weight 138 lb 14.2 oz (63 kg), SpO2 100 %.  Filed Weights   03/07/18 0010 03/07/18 0115 03/08/18 0515  Weight: 148 lb 13 oz (67.5 kg) 140 lb 14 oz (63.9 kg) 138 lb 14.2 oz (63 kg)    Labs & Radiologic Studies    CBC Lab Results  Component Value Date   WBC 13.2 (H) 03/06/2018   HGB 13.9 03/06/2018   HCT 41.0 03/06/2018   MCV 96.2 03/06/2018   PLT 135 (L) 38/75/6433    Basic Metabolic Panel Recent Labs    03/09/18 0612 03/10/18 0533  NA 137 136  K 3.2* 3.4*  CL 110 111  CO2 15* 17*  GLUCOSE 115* 81  BUN 28* 24*  CREATININE 0.98 0.91  CALCIUM 8.4* 8.4*  _____________  Ct Head Wo Contrast  Result Date: 03/07/2018 CLINICAL DATA:  Altered mental status EXAM: CT HEAD WITHOUT CONTRAST TECHNIQUE: Contiguous axial images were obtained from the base of the skull through the vertex without intravenous contrast. COMPARISON:  None. FINDINGS: Brain: No mass lesion, intraparenchymal hemorrhage or extra-axial collection. No evidence of acute cortical infarct. There is periventricular hypoattenuation compatible with chronic microvascular disease. Vascular: No hyperdense vessel or unexpected vascular  calcification. Skull: Normal visualized skull base, calvarium and extracranial soft tissues. Sinuses/Orbits: No sinus fluid levels or advanced mucosal thickening. No mastoid effusion. Normal orbits. IMPRESSION: Chronic small vessel disease without acute intracranial abnormality. Electronically Signed   By: Ulyses Jarred M.D.   On: 03/07/2018 01:19   Dg Chest Port 1 View  Result Date: 03/06/2018 CLINICAL DATA:  Chest pain EXAM: PORTABLE CHEST 1 VIEW COMPARISON:  10/21/2013 chest radiograph. FINDINGS: Pacer pads overlie the left chest. Stable cardiomediastinal silhouette with top-normal heart size. No pneumothorax. No pleural effusion. Lungs appear clear, with no acute consolidative airspace disease and no pulmonary edema. IMPRESSION: No active cardiopulmonary disease. Electronically Signed   By: Ilona Sorrel M.D.   On: 03/06/2018 18:49   Disposition   Pt is being discharged to SNF with hospice today.  Follow-up Plans & Appointments   None  Discharge Medications   Allergies as of 03/11/2018   No Known Allergies     Medication List    STOP taking these medications   digoxin 0.25 MG tablet Commonly known as:  LANOXIN   furosemide 20 MG tablet Commonly  known as:  LASIX   metoprolol tartrate 50 MG tablet Commonly known as:  LOPRESSOR   potassium chloride SA 20 MEQ tablet Commonly known as:  K-DUR,KLOR-CON     TAKE these medications   allopurinol 100 MG tablet Commonly known as:  ZYLOPRIM Take 100 mg by mouth 2 (two) times daily.   aspirin 81 MG chewable tablet Chew 81 mg by mouth daily.   donepezil 10 MG tablet Commonly known as:  ARICEPT Take 10 mg by mouth at bedtime.   levothyroxine 25 MCG tablet Commonly known as:  SYNTHROID, LEVOTHROID Take 25 mcg by mouth daily before breakfast.   loratadine 10 MG tablet Commonly known as:  CLARITIN Take 10 mg by mouth daily.   mupirocin ointment 2 % Commonly known as:  BACTROBAN Place 1 application into the nose 2 (two) times  daily.   NAMENDA XR 28 MG Cp24 24 hr capsule Generic drug:  memantine Take 28 mg by mouth daily.   ofloxacin 0.3 % ophthalmic solution Commonly known as:  OCUFLOX Place 1 drop into both eyes every 4 (four) hours. 0600-2200   Selenium Sulfide 2.25 % Sham Apply topically See admin instructions. Apply to scalp at night, leave on overnight before shower days, then rinse         Outstanding Labs/Studies   None  Duration of Discharge Encounter   Greater than 30 minutes including physician time.  Signed, Murray Hodgkins NP 03/11/2018, 11:23 AM

## 2018-03-11 NOTE — Clinical Social Work Note (Signed)
Clinical Social Worker facilitated patient discharge including contacting patient family and facility to confirm patient discharge plans.  Clinical information faxed to facility and family agreeable with plan.  CSW arranged ambulance transport via [facility] to [facility].  RN to call report prior to discharge.  Clinical Social Worker will sign off for now as social work intervention is no longer needed. Please consult [place] again if new need arises.  Barbette Or, [place]

## 2018-03-12 DIAGNOSIS — I110 Hypertensive heart disease with heart failure: Secondary | ICD-10-CM | POA: Diagnosis not present

## 2018-03-12 DIAGNOSIS — I4891 Unspecified atrial fibrillation: Secondary | ICD-10-CM | POA: Diagnosis not present

## 2018-03-12 DIAGNOSIS — R55 Syncope and collapse: Secondary | ICD-10-CM | POA: Diagnosis not present

## 2018-03-12 DIAGNOSIS — I509 Heart failure, unspecified: Secondary | ICD-10-CM | POA: Diagnosis not present

## 2018-03-12 LAB — CULTURE, BLOOD (ROUTINE X 2)
Culture: NO GROWTH
SPECIAL REQUESTS: ADEQUATE

## 2018-03-13 DIAGNOSIS — Z79899 Other long term (current) drug therapy: Secondary | ICD-10-CM | POA: Diagnosis not present

## 2018-03-15 DIAGNOSIS — I5030 Unspecified diastolic (congestive) heart failure: Secondary | ICD-10-CM | POA: Diagnosis not present

## 2018-03-15 DIAGNOSIS — Z853 Personal history of malignant neoplasm of breast: Secondary | ICD-10-CM | POA: Diagnosis not present

## 2018-03-15 DIAGNOSIS — I4891 Unspecified atrial fibrillation: Secondary | ICD-10-CM | POA: Diagnosis not present

## 2018-03-16 DIAGNOSIS — I5030 Unspecified diastolic (congestive) heart failure: Secondary | ICD-10-CM | POA: Diagnosis not present

## 2018-03-16 DIAGNOSIS — Z853 Personal history of malignant neoplasm of breast: Secondary | ICD-10-CM | POA: Diagnosis not present

## 2018-03-16 DIAGNOSIS — I4891 Unspecified atrial fibrillation: Secondary | ICD-10-CM | POA: Diagnosis not present

## 2018-03-17 DIAGNOSIS — Z853 Personal history of malignant neoplasm of breast: Secondary | ICD-10-CM | POA: Diagnosis not present

## 2018-03-17 DIAGNOSIS — I4891 Unspecified atrial fibrillation: Secondary | ICD-10-CM | POA: Diagnosis not present

## 2018-03-17 DIAGNOSIS — I5030 Unspecified diastolic (congestive) heart failure: Secondary | ICD-10-CM | POA: Diagnosis not present

## 2018-03-18 DIAGNOSIS — I5030 Unspecified diastolic (congestive) heart failure: Secondary | ICD-10-CM | POA: Diagnosis not present

## 2018-03-18 DIAGNOSIS — Z853 Personal history of malignant neoplasm of breast: Secondary | ICD-10-CM | POA: Diagnosis not present

## 2018-03-18 DIAGNOSIS — I4891 Unspecified atrial fibrillation: Secondary | ICD-10-CM | POA: Diagnosis not present

## 2018-03-19 DIAGNOSIS — I5030 Unspecified diastolic (congestive) heart failure: Secondary | ICD-10-CM | POA: Diagnosis not present

## 2018-03-19 DIAGNOSIS — I4891 Unspecified atrial fibrillation: Secondary | ICD-10-CM | POA: Diagnosis not present

## 2018-03-19 DIAGNOSIS — Z853 Personal history of malignant neoplasm of breast: Secondary | ICD-10-CM | POA: Diagnosis not present

## 2018-03-20 DIAGNOSIS — Z853 Personal history of malignant neoplasm of breast: Secondary | ICD-10-CM | POA: Diagnosis not present

## 2018-03-20 DIAGNOSIS — I5030 Unspecified diastolic (congestive) heart failure: Secondary | ICD-10-CM | POA: Diagnosis not present

## 2018-03-20 DIAGNOSIS — I4891 Unspecified atrial fibrillation: Secondary | ICD-10-CM | POA: Diagnosis not present

## 2018-03-21 DIAGNOSIS — I5030 Unspecified diastolic (congestive) heart failure: Secondary | ICD-10-CM | POA: Diagnosis not present

## 2018-03-21 DIAGNOSIS — I4891 Unspecified atrial fibrillation: Secondary | ICD-10-CM | POA: Diagnosis not present

## 2018-03-21 DIAGNOSIS — Z853 Personal history of malignant neoplasm of breast: Secondary | ICD-10-CM | POA: Diagnosis not present

## 2018-03-22 DIAGNOSIS — I4891 Unspecified atrial fibrillation: Secondary | ICD-10-CM | POA: Diagnosis not present

## 2018-03-22 DIAGNOSIS — Z853 Personal history of malignant neoplasm of breast: Secondary | ICD-10-CM | POA: Diagnosis not present

## 2018-03-22 DIAGNOSIS — I5030 Unspecified diastolic (congestive) heart failure: Secondary | ICD-10-CM | POA: Diagnosis not present

## 2018-03-23 DIAGNOSIS — Z853 Personal history of malignant neoplasm of breast: Secondary | ICD-10-CM | POA: Diagnosis not present

## 2018-03-23 DIAGNOSIS — I5030 Unspecified diastolic (congestive) heart failure: Secondary | ICD-10-CM | POA: Diagnosis not present

## 2018-03-23 DIAGNOSIS — I4891 Unspecified atrial fibrillation: Secondary | ICD-10-CM | POA: Diagnosis not present

## 2018-03-24 DIAGNOSIS — Z853 Personal history of malignant neoplasm of breast: Secondary | ICD-10-CM | POA: Diagnosis not present

## 2018-03-24 DIAGNOSIS — I4891 Unspecified atrial fibrillation: Secondary | ICD-10-CM | POA: Diagnosis not present

## 2018-03-24 DIAGNOSIS — I5030 Unspecified diastolic (congestive) heart failure: Secondary | ICD-10-CM | POA: Diagnosis not present

## 2018-03-25 DIAGNOSIS — I4891 Unspecified atrial fibrillation: Secondary | ICD-10-CM | POA: Diagnosis not present

## 2018-03-25 DIAGNOSIS — I5030 Unspecified diastolic (congestive) heart failure: Secondary | ICD-10-CM | POA: Diagnosis not present

## 2018-03-25 DIAGNOSIS — Z853 Personal history of malignant neoplasm of breast: Secondary | ICD-10-CM | POA: Diagnosis not present

## 2018-03-26 DIAGNOSIS — I5030 Unspecified diastolic (congestive) heart failure: Secondary | ICD-10-CM | POA: Diagnosis not present

## 2018-03-26 DIAGNOSIS — Z853 Personal history of malignant neoplasm of breast: Secondary | ICD-10-CM | POA: Diagnosis not present

## 2018-03-26 DIAGNOSIS — I4891 Unspecified atrial fibrillation: Secondary | ICD-10-CM | POA: Diagnosis not present

## 2018-03-27 DIAGNOSIS — I4891 Unspecified atrial fibrillation: Secondary | ICD-10-CM | POA: Diagnosis not present

## 2018-03-27 DIAGNOSIS — I5030 Unspecified diastolic (congestive) heart failure: Secondary | ICD-10-CM | POA: Diagnosis not present

## 2018-03-27 DIAGNOSIS — Z853 Personal history of malignant neoplasm of breast: Secondary | ICD-10-CM | POA: Diagnosis not present

## 2018-03-28 DIAGNOSIS — Z853 Personal history of malignant neoplasm of breast: Secondary | ICD-10-CM | POA: Diagnosis not present

## 2018-03-28 DIAGNOSIS — I4891 Unspecified atrial fibrillation: Secondary | ICD-10-CM | POA: Diagnosis not present

## 2018-03-28 DIAGNOSIS — I5030 Unspecified diastolic (congestive) heart failure: Secondary | ICD-10-CM | POA: Diagnosis not present

## 2018-03-29 DIAGNOSIS — I4891 Unspecified atrial fibrillation: Secondary | ICD-10-CM | POA: Diagnosis not present

## 2018-03-29 DIAGNOSIS — Z853 Personal history of malignant neoplasm of breast: Secondary | ICD-10-CM | POA: Diagnosis not present

## 2018-03-29 DIAGNOSIS — I5030 Unspecified diastolic (congestive) heart failure: Secondary | ICD-10-CM | POA: Diagnosis not present

## 2018-03-30 DIAGNOSIS — I5030 Unspecified diastolic (congestive) heart failure: Secondary | ICD-10-CM | POA: Diagnosis not present

## 2018-03-30 DIAGNOSIS — I4891 Unspecified atrial fibrillation: Secondary | ICD-10-CM | POA: Diagnosis not present

## 2018-03-30 DIAGNOSIS — Z853 Personal history of malignant neoplasm of breast: Secondary | ICD-10-CM | POA: Diagnosis not present

## 2018-03-31 DIAGNOSIS — Z853 Personal history of malignant neoplasm of breast: Secondary | ICD-10-CM | POA: Diagnosis not present

## 2018-03-31 DIAGNOSIS — I5030 Unspecified diastolic (congestive) heart failure: Secondary | ICD-10-CM | POA: Diagnosis not present

## 2018-03-31 DIAGNOSIS — I4891 Unspecified atrial fibrillation: Secondary | ICD-10-CM | POA: Diagnosis not present

## 2018-04-01 DIAGNOSIS — Z853 Personal history of malignant neoplasm of breast: Secondary | ICD-10-CM | POA: Diagnosis not present

## 2018-04-01 DIAGNOSIS — I4891 Unspecified atrial fibrillation: Secondary | ICD-10-CM | POA: Diagnosis not present

## 2018-04-01 DIAGNOSIS — I5030 Unspecified diastolic (congestive) heart failure: Secondary | ICD-10-CM | POA: Diagnosis not present

## 2018-04-02 DIAGNOSIS — I5030 Unspecified diastolic (congestive) heart failure: Secondary | ICD-10-CM | POA: Diagnosis not present

## 2018-04-02 DIAGNOSIS — I4891 Unspecified atrial fibrillation: Secondary | ICD-10-CM | POA: Diagnosis not present

## 2018-04-02 DIAGNOSIS — Z853 Personal history of malignant neoplasm of breast: Secondary | ICD-10-CM | POA: Diagnosis not present

## 2018-04-03 DIAGNOSIS — Z853 Personal history of malignant neoplasm of breast: Secondary | ICD-10-CM | POA: Diagnosis not present

## 2018-04-03 DIAGNOSIS — I5030 Unspecified diastolic (congestive) heart failure: Secondary | ICD-10-CM | POA: Diagnosis not present

## 2018-04-03 DIAGNOSIS — I4891 Unspecified atrial fibrillation: Secondary | ICD-10-CM | POA: Diagnosis not present

## 2018-04-04 DIAGNOSIS — Z853 Personal history of malignant neoplasm of breast: Secondary | ICD-10-CM | POA: Diagnosis not present

## 2018-04-04 DIAGNOSIS — I5030 Unspecified diastolic (congestive) heart failure: Secondary | ICD-10-CM | POA: Diagnosis not present

## 2018-04-04 DIAGNOSIS — I4891 Unspecified atrial fibrillation: Secondary | ICD-10-CM | POA: Diagnosis not present

## 2018-04-05 DIAGNOSIS — I5030 Unspecified diastolic (congestive) heart failure: Secondary | ICD-10-CM | POA: Diagnosis not present

## 2018-04-05 DIAGNOSIS — Z853 Personal history of malignant neoplasm of breast: Secondary | ICD-10-CM | POA: Diagnosis not present

## 2018-04-05 DIAGNOSIS — I4891 Unspecified atrial fibrillation: Secondary | ICD-10-CM | POA: Diagnosis not present

## 2018-04-06 DIAGNOSIS — I5030 Unspecified diastolic (congestive) heart failure: Secondary | ICD-10-CM | POA: Diagnosis not present

## 2018-04-06 DIAGNOSIS — Z853 Personal history of malignant neoplasm of breast: Secondary | ICD-10-CM | POA: Diagnosis not present

## 2018-04-06 DIAGNOSIS — I4891 Unspecified atrial fibrillation: Secondary | ICD-10-CM | POA: Diagnosis not present

## 2018-04-07 DIAGNOSIS — I5030 Unspecified diastolic (congestive) heart failure: Secondary | ICD-10-CM | POA: Diagnosis not present

## 2018-04-07 DIAGNOSIS — Z853 Personal history of malignant neoplasm of breast: Secondary | ICD-10-CM | POA: Diagnosis not present

## 2018-04-07 DIAGNOSIS — I4891 Unspecified atrial fibrillation: Secondary | ICD-10-CM | POA: Diagnosis not present

## 2018-04-08 DIAGNOSIS — I4891 Unspecified atrial fibrillation: Secondary | ICD-10-CM | POA: Diagnosis not present

## 2018-04-08 DIAGNOSIS — I5030 Unspecified diastolic (congestive) heart failure: Secondary | ICD-10-CM | POA: Diagnosis not present

## 2018-04-08 DIAGNOSIS — Z853 Personal history of malignant neoplasm of breast: Secondary | ICD-10-CM | POA: Diagnosis not present

## 2018-04-09 DIAGNOSIS — I4891 Unspecified atrial fibrillation: Secondary | ICD-10-CM | POA: Diagnosis not present

## 2018-04-09 DIAGNOSIS — Z853 Personal history of malignant neoplasm of breast: Secondary | ICD-10-CM | POA: Diagnosis not present

## 2018-04-09 DIAGNOSIS — I5030 Unspecified diastolic (congestive) heart failure: Secondary | ICD-10-CM | POA: Diagnosis not present

## 2018-04-10 DIAGNOSIS — I5030 Unspecified diastolic (congestive) heart failure: Secondary | ICD-10-CM | POA: Diagnosis not present

## 2018-04-10 DIAGNOSIS — Z853 Personal history of malignant neoplasm of breast: Secondary | ICD-10-CM | POA: Diagnosis not present

## 2018-04-10 DIAGNOSIS — I4891 Unspecified atrial fibrillation: Secondary | ICD-10-CM | POA: Diagnosis not present

## 2018-04-11 DIAGNOSIS — Z853 Personal history of malignant neoplasm of breast: Secondary | ICD-10-CM | POA: Diagnosis not present

## 2018-04-11 DIAGNOSIS — I5030 Unspecified diastolic (congestive) heart failure: Secondary | ICD-10-CM | POA: Diagnosis not present

## 2018-04-11 DIAGNOSIS — I4891 Unspecified atrial fibrillation: Secondary | ICD-10-CM | POA: Diagnosis not present

## 2018-04-12 DIAGNOSIS — I4891 Unspecified atrial fibrillation: Secondary | ICD-10-CM | POA: Diagnosis not present

## 2018-04-12 DIAGNOSIS — I5030 Unspecified diastolic (congestive) heart failure: Secondary | ICD-10-CM | POA: Diagnosis not present

## 2018-04-12 DIAGNOSIS — Z853 Personal history of malignant neoplasm of breast: Secondary | ICD-10-CM | POA: Diagnosis not present

## 2018-04-13 DIAGNOSIS — Z853 Personal history of malignant neoplasm of breast: Secondary | ICD-10-CM | POA: Diagnosis not present

## 2018-04-13 DIAGNOSIS — I5030 Unspecified diastolic (congestive) heart failure: Secondary | ICD-10-CM | POA: Diagnosis not present

## 2018-04-13 DIAGNOSIS — I4891 Unspecified atrial fibrillation: Secondary | ICD-10-CM | POA: Diagnosis not present

## 2018-04-14 DIAGNOSIS — I5030 Unspecified diastolic (congestive) heart failure: Secondary | ICD-10-CM | POA: Diagnosis not present

## 2018-04-14 DIAGNOSIS — I4891 Unspecified atrial fibrillation: Secondary | ICD-10-CM | POA: Diagnosis not present

## 2018-04-14 DIAGNOSIS — Z853 Personal history of malignant neoplasm of breast: Secondary | ICD-10-CM | POA: Diagnosis not present

## 2018-04-15 DIAGNOSIS — I5030 Unspecified diastolic (congestive) heart failure: Secondary | ICD-10-CM | POA: Diagnosis not present

## 2018-04-15 DIAGNOSIS — I4891 Unspecified atrial fibrillation: Secondary | ICD-10-CM | POA: Diagnosis not present

## 2018-04-15 DIAGNOSIS — Z853 Personal history of malignant neoplasm of breast: Secondary | ICD-10-CM | POA: Diagnosis not present

## 2018-04-16 DIAGNOSIS — I509 Heart failure, unspecified: Secondary | ICD-10-CM | POA: Diagnosis not present

## 2018-04-16 DIAGNOSIS — I5030 Unspecified diastolic (congestive) heart failure: Secondary | ICD-10-CM | POA: Diagnosis not present

## 2018-04-16 DIAGNOSIS — I4891 Unspecified atrial fibrillation: Secondary | ICD-10-CM | POA: Diagnosis not present

## 2018-04-16 DIAGNOSIS — C50911 Malignant neoplasm of unspecified site of right female breast: Secondary | ICD-10-CM | POA: Diagnosis not present

## 2018-04-16 DIAGNOSIS — H25813 Combined forms of age-related cataract, bilateral: Secondary | ICD-10-CM | POA: Diagnosis not present

## 2018-04-16 DIAGNOSIS — Z853 Personal history of malignant neoplasm of breast: Secondary | ICD-10-CM | POA: Diagnosis not present

## 2018-04-16 DIAGNOSIS — D4981 Neoplasm of unspecified behavior of retina and choroid: Secondary | ICD-10-CM | POA: Diagnosis not present

## 2018-04-17 DIAGNOSIS — Z853 Personal history of malignant neoplasm of breast: Secondary | ICD-10-CM | POA: Diagnosis not present

## 2018-04-17 DIAGNOSIS — I4891 Unspecified atrial fibrillation: Secondary | ICD-10-CM | POA: Diagnosis not present

## 2018-04-17 DIAGNOSIS — I5030 Unspecified diastolic (congestive) heart failure: Secondary | ICD-10-CM | POA: Diagnosis not present

## 2018-04-18 DIAGNOSIS — Z853 Personal history of malignant neoplasm of breast: Secondary | ICD-10-CM | POA: Diagnosis not present

## 2018-04-18 DIAGNOSIS — I5030 Unspecified diastolic (congestive) heart failure: Secondary | ICD-10-CM | POA: Diagnosis not present

## 2018-04-18 DIAGNOSIS — I4891 Unspecified atrial fibrillation: Secondary | ICD-10-CM | POA: Diagnosis not present

## 2018-04-19 DIAGNOSIS — I4891 Unspecified atrial fibrillation: Secondary | ICD-10-CM | POA: Diagnosis not present

## 2018-04-19 DIAGNOSIS — Z853 Personal history of malignant neoplasm of breast: Secondary | ICD-10-CM | POA: Diagnosis not present

## 2018-04-19 DIAGNOSIS — I5030 Unspecified diastolic (congestive) heart failure: Secondary | ICD-10-CM | POA: Diagnosis not present

## 2018-04-20 DIAGNOSIS — Z853 Personal history of malignant neoplasm of breast: Secondary | ICD-10-CM | POA: Diagnosis not present

## 2018-04-20 DIAGNOSIS — I4891 Unspecified atrial fibrillation: Secondary | ICD-10-CM | POA: Diagnosis not present

## 2018-04-20 DIAGNOSIS — I5030 Unspecified diastolic (congestive) heart failure: Secondary | ICD-10-CM | POA: Diagnosis not present

## 2018-04-21 DIAGNOSIS — Z853 Personal history of malignant neoplasm of breast: Secondary | ICD-10-CM | POA: Diagnosis not present

## 2018-04-21 DIAGNOSIS — I5030 Unspecified diastolic (congestive) heart failure: Secondary | ICD-10-CM | POA: Diagnosis not present

## 2018-04-21 DIAGNOSIS — I4891 Unspecified atrial fibrillation: Secondary | ICD-10-CM | POA: Diagnosis not present

## 2018-04-22 DIAGNOSIS — I5030 Unspecified diastolic (congestive) heart failure: Secondary | ICD-10-CM | POA: Diagnosis not present

## 2018-04-22 DIAGNOSIS — Z853 Personal history of malignant neoplasm of breast: Secondary | ICD-10-CM | POA: Diagnosis not present

## 2018-04-22 DIAGNOSIS — I4891 Unspecified atrial fibrillation: Secondary | ICD-10-CM | POA: Diagnosis not present

## 2018-04-23 DIAGNOSIS — I4891 Unspecified atrial fibrillation: Secondary | ICD-10-CM | POA: Diagnosis not present

## 2018-04-23 DIAGNOSIS — I5030 Unspecified diastolic (congestive) heart failure: Secondary | ICD-10-CM | POA: Diagnosis not present

## 2018-04-23 DIAGNOSIS — Z853 Personal history of malignant neoplasm of breast: Secondary | ICD-10-CM | POA: Diagnosis not present

## 2018-05-05 DEATH — deceased

## 2019-03-19 IMAGING — CT CT HEAD W/O CM
4 series · 17 of 47 positions shown, 19 images · non-contrast
Comparison: None.

CLINICAL DATA: Altered mental status

EXAM:
CT HEAD WITHOUT CONTRAST
TECHNIQUE: Contiguous axial images were obtained from the base of the skull
through the vertex without intravenous contrast.

[Series 3: head without · axial · non-contrast · 0.42mm/px · z∈[-148,-28]mm · 7 of 34 slices shown, 9 images]
[im 5/34  brain]
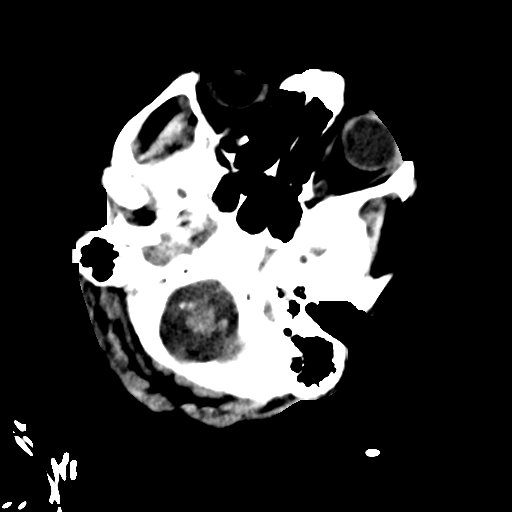
[im 5/34  bone]
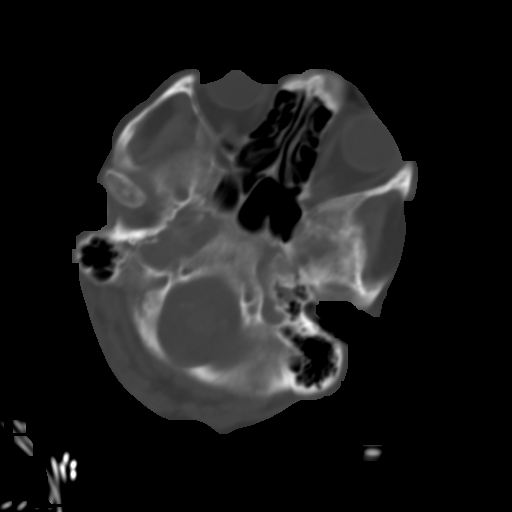
[im 9/34  brain]
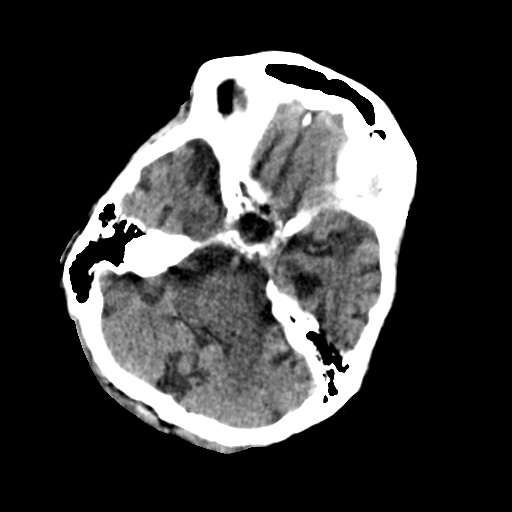
[im 13/34  brain]
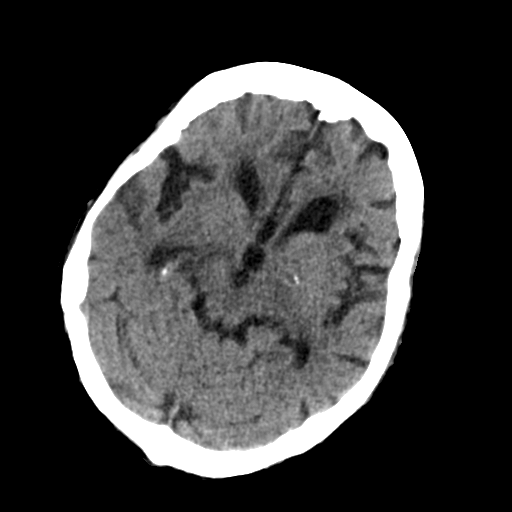
[im 17/34  brain]
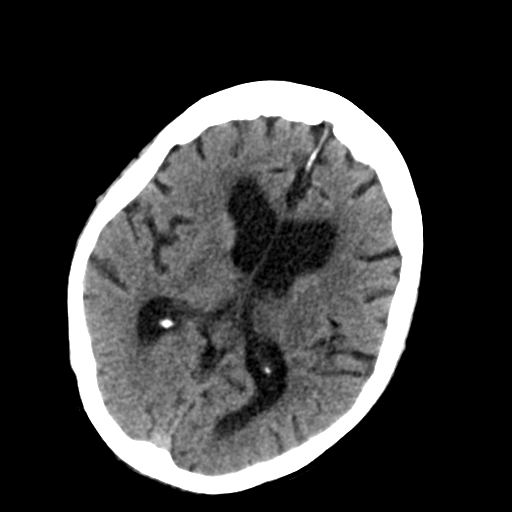
[im 21/34  brain]
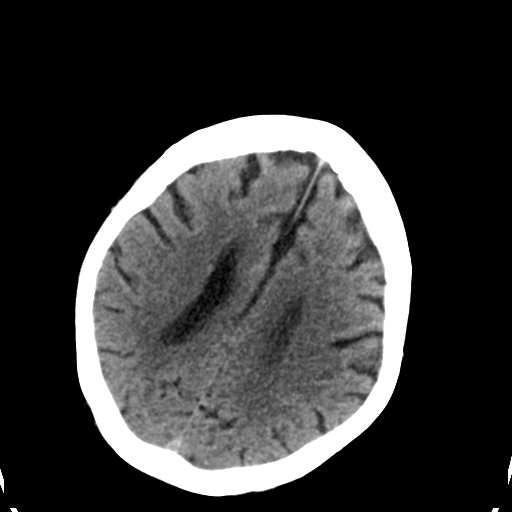
[im 21/34  bone]
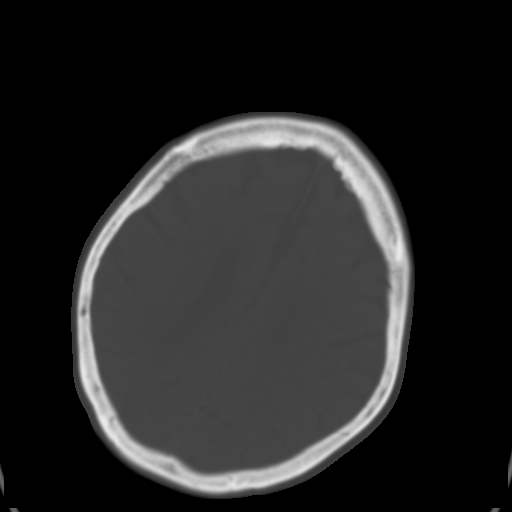
[im 25/34  brain]
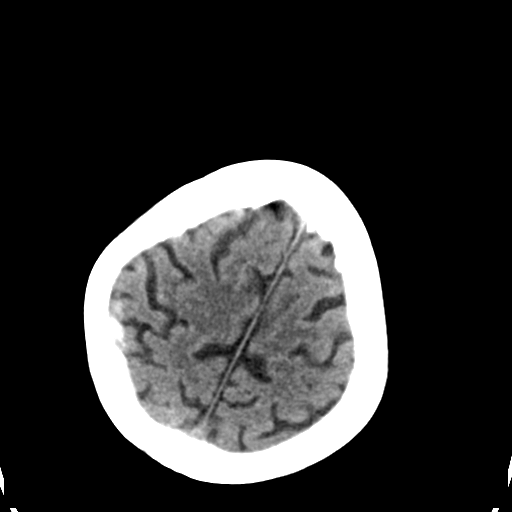
[im 29/34  brain]
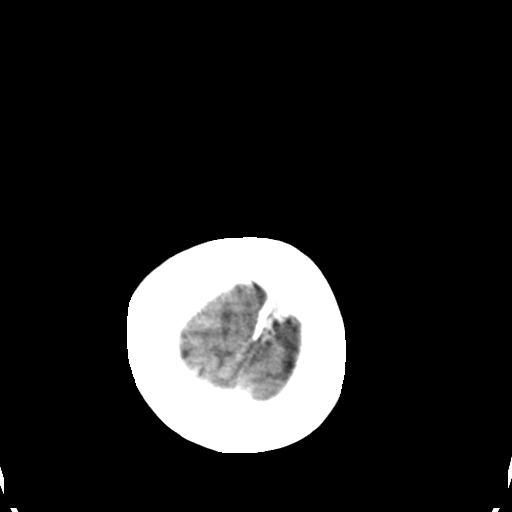

[Series 4: head bone · axial · 0.42mm/px · z∈[-152,-94]mm · 4 of 84 slices shown]
[im 9/84  bone]
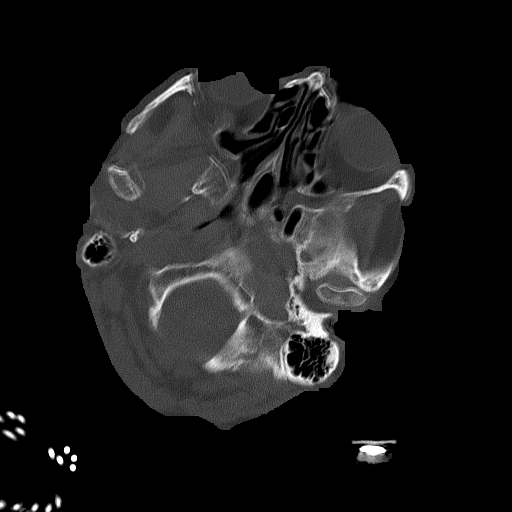
[im 17/84  bone]
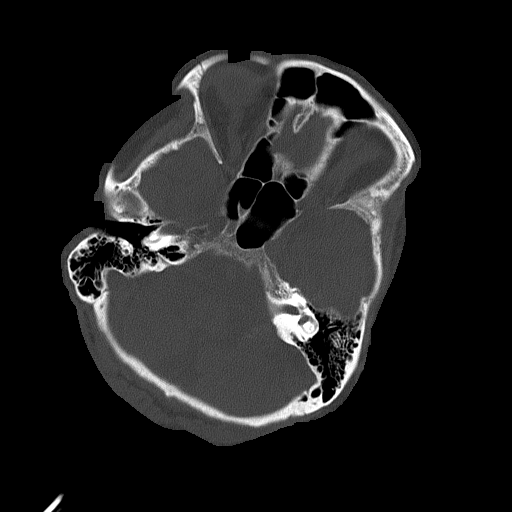
[im 25/84  bone]
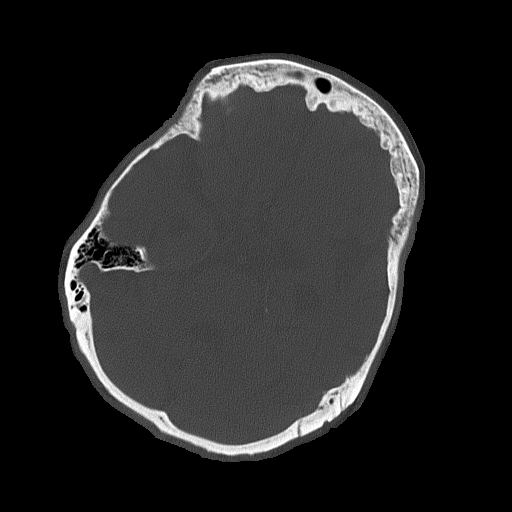
[im 38/84  bone]
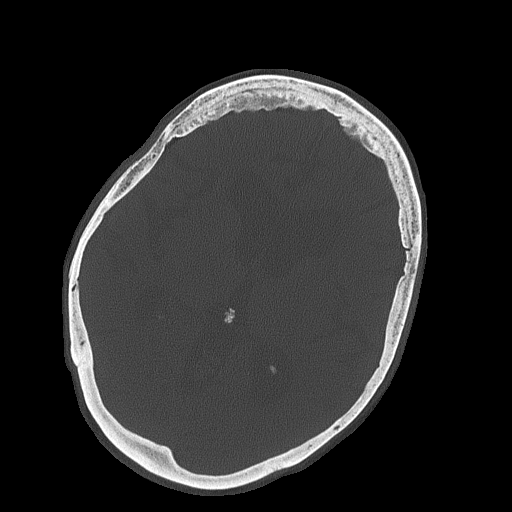

[Series 5: head without cor · coronal · non-contrast · 0.30mm/px · 3 of 64 slices shown]
[im 22/64  brain]
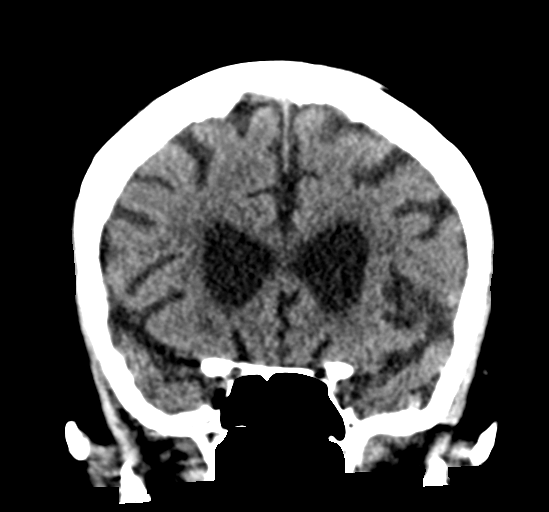
[im 29/64  brain]
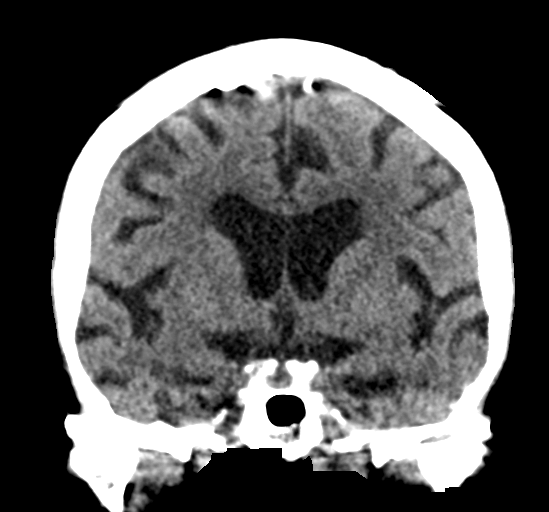
[im 36/64  brain]
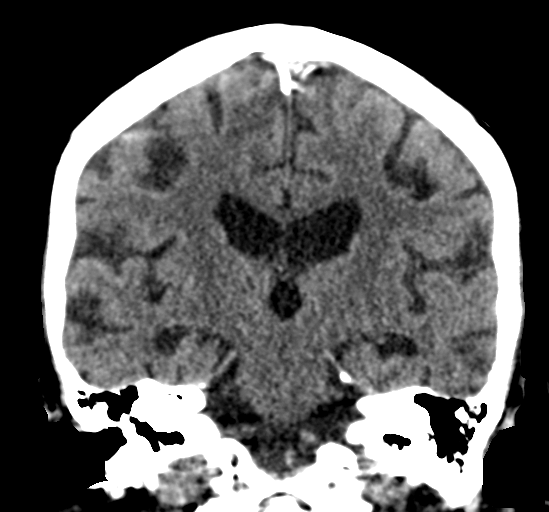

[Series 6: head without sag · sagittal · non-contrast · 0.34mm/px · 3 of 54 slices shown]
[im 23/54  brain]
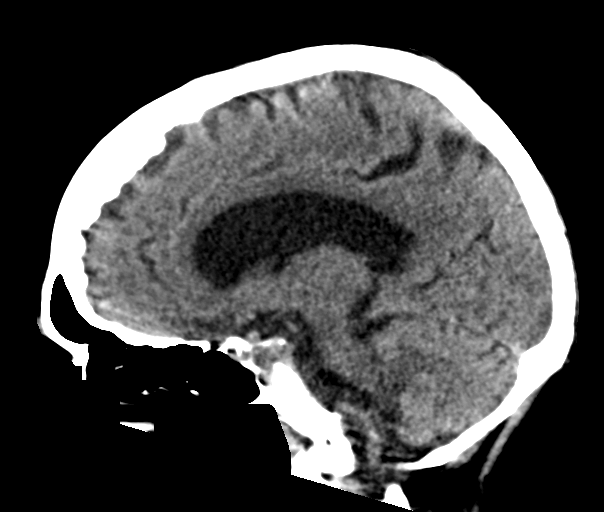
[im 27/54  brain]
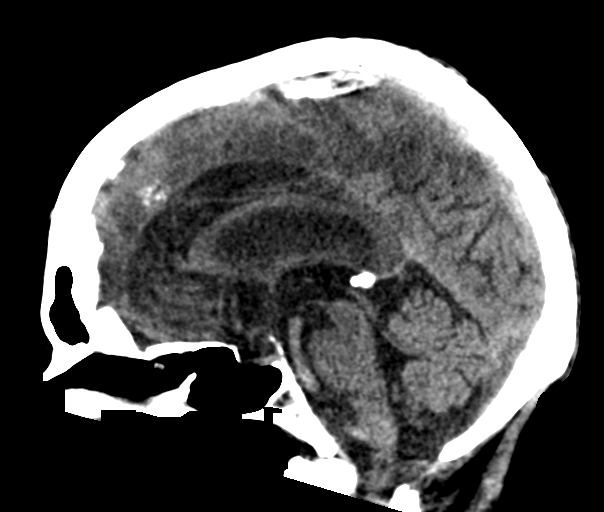
[im 32/54  brain]
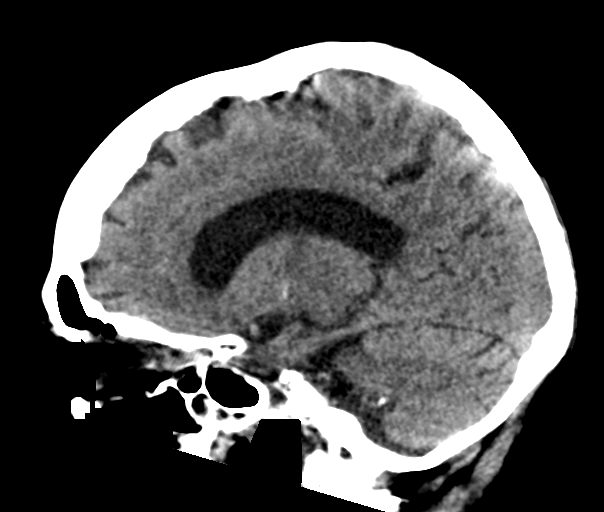

[17 of 47 positions shown; findings below may reference images not displayed]

FINDINGS: Brain: No mass lesion, intraparenchymal hemorrhage or extra-axial
collection. No evidence of acute cortical infarct. There is
periventricular hypoattenuation compatible with chronic
microvascular disease.

Vascular: No hyperdense vessel or unexpected vascular calcification.

Skull: Normal visualized skull base, calvarium and extracranial soft
tissues.

Sinuses/Orbits: No sinus fluid levels or advanced mucosal
thickening. No mastoid effusion. Normal orbits.
IMPRESSION: Chronic small vessel disease without acute intracranial abnormality.
